# Patient Record
Sex: Female | Born: 1960 | Race: White | Hispanic: No | Marital: Married | Smoking: Former smoker
Health system: Southern US, Community
[De-identification: ages and names within clinical notes are randomized; demographics above are authoritative.]

## PROBLEM LIST (undated history)

## (undated) DIAGNOSIS — F329 Major depressive disorder, single episode, unspecified: Secondary | ICD-10-CM

## (undated) DIAGNOSIS — F32A Depression, unspecified: Secondary | ICD-10-CM

## (undated) DIAGNOSIS — Z5189 Encounter for other specified aftercare: Secondary | ICD-10-CM

## (undated) DIAGNOSIS — H269 Unspecified cataract: Secondary | ICD-10-CM

## (undated) DIAGNOSIS — F419 Anxiety disorder, unspecified: Secondary | ICD-10-CM

## (undated) DIAGNOSIS — G8929 Other chronic pain: Secondary | ICD-10-CM

## (undated) DIAGNOSIS — Z87442 Personal history of urinary calculi: Secondary | ICD-10-CM

## (undated) DIAGNOSIS — M549 Dorsalgia, unspecified: Secondary | ICD-10-CM

## (undated) DIAGNOSIS — C801 Malignant (primary) neoplasm, unspecified: Secondary | ICD-10-CM

## (undated) DIAGNOSIS — M1990 Unspecified osteoarthritis, unspecified site: Secondary | ICD-10-CM

## (undated) DIAGNOSIS — E079 Disorder of thyroid, unspecified: Secondary | ICD-10-CM

## (undated) DIAGNOSIS — R011 Cardiac murmur, unspecified: Secondary | ICD-10-CM

## (undated) HISTORY — DX: Unspecified cataract: H26.9

## (undated) HISTORY — DX: Major depressive disorder, single episode, unspecified: F32.9

## (undated) HISTORY — DX: Malignant (primary) neoplasm, unspecified: C80.1

## (undated) HISTORY — DX: Cardiac murmur, unspecified: R01.1

## (undated) HISTORY — DX: Anxiety disorder, unspecified: F41.9

## (undated) HISTORY — DX: Depression, unspecified: F32.A

## (undated) HISTORY — DX: Other chronic pain: G89.29

## (undated) HISTORY — DX: Disorder of thyroid, unspecified: E07.9

## (undated) HISTORY — DX: Dorsalgia, unspecified: M54.9

## (undated) HISTORY — PX: WRIST FRACTURE SURGERY: SHX121

## (undated) HISTORY — DX: Encounter for other specified aftercare: Z51.89

## (undated) HISTORY — DX: Unspecified osteoarthritis, unspecified site: M19.90

---

## 1985-12-15 HISTORY — PX: LUMBAR LAMINECTOMY: SHX95

## 1986-12-15 HISTORY — PX: OTHER SURGICAL HISTORY: SHX169

## 1986-12-15 HISTORY — PX: LUMBAR LAMINECTOMY: SHX95

## 1998-12-15 HISTORY — PX: OTHER SURGICAL HISTORY: SHX169

## 1999-04-22 ENCOUNTER — Other Ambulatory Visit: Admission: RE | Admit: 1999-04-22 | Discharge: 1999-04-22 | Payer: Self-pay | Admitting: Obstetrics and Gynecology

## 2000-06-05 ENCOUNTER — Other Ambulatory Visit: Admission: RE | Admit: 2000-06-05 | Discharge: 2000-06-05 | Payer: Self-pay | Admitting: Obstetrics and Gynecology

## 2001-06-07 ENCOUNTER — Other Ambulatory Visit: Admission: RE | Admit: 2001-06-07 | Discharge: 2001-06-07 | Payer: Self-pay | Admitting: Obstetrics and Gynecology

## 2001-06-10 ENCOUNTER — Encounter: Admission: RE | Admit: 2001-06-10 | Discharge: 2001-08-14 | Payer: Self-pay | Admitting: Anesthesiology

## 2003-05-17 ENCOUNTER — Encounter: Admission: RE | Admit: 2003-05-17 | Discharge: 2003-05-17 | Payer: Self-pay | Admitting: Obstetrics and Gynecology

## 2003-05-17 ENCOUNTER — Encounter: Payer: Self-pay | Admitting: Obstetrics and Gynecology

## 2004-06-07 ENCOUNTER — Encounter: Admission: RE | Admit: 2004-06-07 | Discharge: 2004-06-07 | Payer: Self-pay | Admitting: Obstetrics and Gynecology

## 2004-06-11 ENCOUNTER — Encounter (INDEPENDENT_AMBULATORY_CARE_PROVIDER_SITE_OTHER): Payer: Self-pay | Admitting: *Deleted

## 2004-06-11 ENCOUNTER — Encounter: Admission: RE | Admit: 2004-06-11 | Discharge: 2004-06-11 | Payer: Self-pay | Admitting: Obstetrics and Gynecology

## 2004-12-23 ENCOUNTER — Encounter: Admission: RE | Admit: 2004-12-23 | Discharge: 2004-12-23 | Payer: Self-pay | Admitting: Obstetrics and Gynecology

## 2005-07-03 ENCOUNTER — Ambulatory Visit (HOSPITAL_COMMUNITY): Admission: RE | Admit: 2005-07-03 | Discharge: 2005-07-03 | Payer: Self-pay | Admitting: Anesthesiology

## 2005-08-28 ENCOUNTER — Encounter: Admission: RE | Admit: 2005-08-28 | Discharge: 2005-08-28 | Payer: Self-pay | Admitting: Obstetrics and Gynecology

## 2007-07-08 ENCOUNTER — Ambulatory Visit: Payer: Self-pay | Admitting: Gastroenterology

## 2007-08-04 ENCOUNTER — Encounter: Payer: Self-pay | Admitting: Gastroenterology

## 2007-08-04 ENCOUNTER — Ambulatory Visit: Payer: Self-pay | Admitting: Gastroenterology

## 2007-08-04 DIAGNOSIS — K2970 Gastritis, unspecified, without bleeding: Secondary | ICD-10-CM | POA: Insufficient documentation

## 2007-08-04 DIAGNOSIS — K2990 Gastroduodenitis, unspecified, without bleeding: Secondary | ICD-10-CM

## 2007-09-27 ENCOUNTER — Ambulatory Visit: Payer: Self-pay | Admitting: Gastroenterology

## 2008-01-06 ENCOUNTER — Encounter: Admission: RE | Admit: 2008-01-06 | Discharge: 2008-01-06 | Payer: Self-pay | Admitting: Obstetrics and Gynecology

## 2008-02-24 DIAGNOSIS — K648 Other hemorrhoids: Secondary | ICD-10-CM | POA: Insufficient documentation

## 2008-02-24 DIAGNOSIS — Z8719 Personal history of other diseases of the digestive system: Secondary | ICD-10-CM

## 2008-02-24 DIAGNOSIS — K219 Gastro-esophageal reflux disease without esophagitis: Secondary | ICD-10-CM

## 2008-02-24 DIAGNOSIS — K5909 Other constipation: Secondary | ICD-10-CM

## 2008-02-24 DIAGNOSIS — N200 Calculus of kidney: Secondary | ICD-10-CM | POA: Insufficient documentation

## 2008-02-24 DIAGNOSIS — F329 Major depressive disorder, single episode, unspecified: Secondary | ICD-10-CM

## 2008-04-13 ENCOUNTER — Encounter: Payer: Self-pay | Admitting: Gastroenterology

## 2008-10-05 ENCOUNTER — Encounter: Payer: Self-pay | Admitting: Gastroenterology

## 2009-10-01 ENCOUNTER — Encounter: Admission: RE | Admit: 2009-10-01 | Discharge: 2009-10-01 | Payer: Self-pay | Admitting: Anesthesiology

## 2011-04-29 NOTE — Assessment & Plan Note (Signed)
[facility]                         GASTROENTEROLOGY OFFICE NOTE   Renee, Allen                         MRN:          [id number]  DATE:09/27/2007                            DOB:          02-Mar-1961    [name] returns for followup of GERD. Her reflux symptoms are under  excellent control on Pantoprazole 40 mg b.i.d. and standard anti-reflux  measures. She has had no further episodes of hematochezia and notes no  constipation. She has no gastrointestinal complaints today.   CURRENT MEDICATIONS:  Listed on the chart, updated and reviewed.   MEDICATION ALLERGIES:  None known.   PHYSICAL EXAMINATION:  Weight 135 pounds, blood pressure 112/72, pulse  80 and regular. She is not re-examined.   ASSESSMENT/PLAN:  1. Gastroesophageal reflux disease. Maintain standard anti-reflux      measures with 4 inch bed blocks. Continue pantoprazole 40 mg p.o.      b.i.d. for the next 2-3 months and then she may decrease to daily      therapy. If this is not adequate, she will resume b.i.d. treatment.      Return office visit one year.  2. Constipation. Maintain a longterm high fiber diet and adequate      fluid intake. Her constipation has resolved.     Venita Lick. Russella Dar, MD, [facility]  Electronically Signed    MTS/MedQ  DD: 09/27/2007  DT: 09/27/2007  Job #: [id number]   cc:   Nadine Counts

## 2011-04-29 NOTE — Assessment & Plan Note (Signed)
[name]                         GASTROENTEROLOGY OFFICE NOTE   [name]                         MRN:          [id number]  DATE:07/08/2007                            DOB:          January 24, 1961    REASON FOR CONSULTATION:  GERD and rectal bleeding.   HISTORY OF PRESENT ILLNESS:  This is a 50 year old white female who has  had problems with frequent reflux for about the past 5-7 years.  She has  tried pantoprazole, Nexium, Prilosec and Zantac.  She has had only fair  relief with the regular use of pantoprazole but she feels this is better  than the other medications she has tried.  She has had episodes of  nausea and vomiting, and she notes worsening problems with epigastric  pain and reflux symptoms when she is off acid suppressants.  She has had  chronic constipation that has not changed.  She notes a 10 pounds weight  loss.  She has a history of intermittent small volume hematochezia over  the past 2 years but about 3 weeks ago she had a moderate amount of  bright red blood per rectum associated with a bowel movement.  She  relates prior diagnosis of internal hemorrhoids.  She notes no change in  stool caliber, melena or hematemesis.  No family history of colon  cancer, colon polyps or inflammatory bowel disease.   PAST MEDICAL HISTORY:  1. GERD.  2. Post ablative hypothyroidism.  3. Depression.  4. Kidney stones.  5. Status post lumbar fusion x2.  6. Status post laminectomy x2.   CURRENT MEDICATIONS:  Listed on the chart.   MEDICATION ALLERGIES:  NONE KNOWN.   SOCIAL HISTORY:  Per the handwritten form.   REVIEW OF SYSTEMS:  Per the handwritten form.   PHYSICAL EXAMINATION:  Well-developed, well-nourished, no acute  distress.  Height 5 feet 6 inches, weight 137 pounds, blood pressure is  114/80, pulse 80 and regular.  HEENT:  Anicteric sclerae, oropharynx clear.  CHEST:  Clear to auscultation bilaterally.  CARDIAC:  Regular  rate and rhythm without murmurs appreciated.  ABDOMEN:  Soft, nontender, nondistended, normoactive bowel sounds; no  palpable organomegaly, masses or hernias.  RECTAL:  Examination deferred to time of colonoscopy.  EXTREMITIES:  Without clubbing, cyanosis or edema.  NEUROLOGIC:  Alert and oriented x3.  Grossly nonfocal.   ASSESSMENT AND PLAN:  1. Chronic gastroesophageal reflux disease with only fair control of      symptoms.  Rule out erosive esophagitis and Barrett's esophagus.      She is to maintain all standard anti-reflux measures and begin the      use of 4 inch bed blocks.  Continue pantoprazole 40 mg p.o. daily.      She may need to increase to b.i.d. therapy after the EGD.  Risks,      benefits and alternatives to upper endoscopy with possible biopsy      discussed with the patient and she consents to proceed.  This will      be scheduled electively.  2. Worsening hematochezia in the setting of chronic  constipation.      Rule out colorectal neoplasms, hemorrhoids and other disorders.      Risks, benefits and alternatives to colonoscopy with possible      biopsy, possible polypectomy and possible destruction of internal      hemorrhoids discussed with the patient and she consents to proceed.      This will be scheduled electively.  She will begin a stool softener      on a daily basis and increase her fiber and fluid intake.  She may      use milk of magnesia and MiraLax on an as needed basis.     [name]. Renee Dar, MD, [facility]  Electronically Signed    MTS/MedQ  DD: 07/13/2007  DT: 07/14/2007  Job #: [id number]   cc:   [name]

## 2011-05-02 NOTE — H&P (Signed)
[facility]  Patient:    [name], [name]                         MRN: [id number] Adm. Date:  [id number] Attending:  Thyra Breed CC:         Dr. Imagene Sheller, M.D., Ph.D.  Michell Heinrich, M.D.   History and Physical  NEW PATIENT EVALUATION  DATE OF BIRTH:  [birth year]  HISTORY OF PRESENT ILLNESS:  [name] is a very pleasant, 50 year old who is sent to [place] by Dr. Polly Cobia for evaluation and management of her back pain.  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  She was operated on by Dr. Marinell Blight in August of 1987 and did great.  She felt so good that she over exerted herself and had a recurrent disc herniation at L4/L5, and in January had a repeat surgery, but did not have as good an outcome.  In the postoperative period she apparently sneezed and reinjured her back.  It was not until June 1988 that she underwent a fusion by Dr. Nicholes Rough and Harl Bowie.  She did well following this up until 2000.  In 2000, she and her husband bought a new home and they were landscaping and she heard a pop in her back.  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  She did not followup with him, but returned to to her primary care physician, Dr. Harrison Mons, who sent her to Dr. Meryl Dare at [facility] who evaluated her. After his evaluations he had Dr. Dorita Fray evaluate her and together they did laminotomies from L2 to L5 as well as diskectomy at L3/L4 and fusion. Postoperative she stated she did very well, and she is very glad that she had the operation, but she was left with some pain.  She was placed on OxyContin in January 2002 by Dr. Rise Mu at 20 mg 2 x q.d. and had  gone up to 40 mg 2 x q.d. with oxycodone 5 mg, up to 6 tablets a day for control of her discomfort.  She presents today for evaluation and maintenance of her opiates.  She states with the OxyContin she is able to work and carry on a productive life.  She has a 90-year-old child at home.  She does feel a considerable amount of pressure and guilt from the family over the fact that she takes OxyContin, but states that if she does not take it she cannot work.  She has been tried on adjuvants in the form of Neurontin and Flexeril, but they make her too sleepy to take.  She takes Vioxx which is helpful.  She has been on Prozac for 12 years.  At present, she feels pretty good.  She notes that her pain is increased with sitting and she does intermittently numbness and tingling over the lateral thighs with prolonged sitting.  She denied any bowel or bladder incontinence. She notes that when she can leans forward for prolonged periods of time she develops shakiness overall.  CURRENT MEDICATIONS: 1. OxyContin 40 mg b.i.d. 2. Oxycodone 5 mg 2 q.3-4h.; she takes up to 6 tablets q.d. 3. Flexeril 10 mg p.r.n. 4. Vioxx  25 mg q.d. 5. Birth control pills. 6. Prozac 20 mg q.d.  ALLERGIES:  No known drug allergies.  FAMILY HISTORY:  Positive for thyroid disease and prostate cancer.  ACTIVE MEDICAL PROBLEMS:  History of Graves disease and osteoarthritis with history of cellulitis for which she was recently hospitalized by Dr. Harrison Mons.  PAST SURGICAL HISTORY:  Significant for the four back surgeries, otherwise no other surgeries.  SOCIAL HISTORY:  The patient is a nonsmoker and nondrinker.  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  REVIEW OF SYSTEMS:  GENERAL:  Significant for long history of night sweats. HEAD:  Negative.  EYES:  Negative.  NOSE/MOUTH/THROAT/EARS:  Negative. PULMONARY:  Negative.  CARDIOVASCULAR:  Significant for murmur for which she sees Dr. Harrison Mons.  GI:  Significant for  belching when she takes the OxyContin, but minimal constipation.  GU:  History of renal calculi.  MUSCULOSKELETAL: See HPI.  NEUROLOGIC:  See HPI.  No history of seizure or stroke.  CUTANEOUS: History of cellulitis recently.  ENDOCRINE:  History of Graves disease treated by Dr. Criss Alvine, no diabetes.  HEMATOLOGIC:  Negative.  PSYCHIATRIC:  Positive for depression for which she is on Prozac.  ALLERGY/IMMUNOLOGIC:  Negative.  PHYSICAL EXAMINATION:  VITAL SIGNS:  Blood pressure 132/69, heart rate 72, respiratory rate 16, and O2 saturation 99%.  Temperature 99.5.  Pain level 7/10.  GENERAL:  This is a pleasant female in no acute distress.  HEAD:  Normocephalic, atraumatic.  EYES:  Extraocular movements intact with conjunctivae and sclerae clear.  NOSE:  Patent nares.  OROPHARYNX:  Free of lesions.  NECK:  Supple without lymphadenopathy.  She does have a palpable thyroid. Carotids were 2+ and symmetric without bruits.  LUNGS:  Clear.  HEART:  Regular rate and rhythm with a grade 2/6 systolic murmur along left sternal border.  BREASTS/ABDOMINAL/PELVIC/RECTAL:  Exams were not performed.  BACK:  Exam revealed well-healed surgical scar with gibbus-type change at the top of her fusion.  She has intact forward flexion and fairly intact extension.  Straight leg raise signs were negative.  EXTREMITIES:  Radial pulses and dorsalis pedis pulses were 2+ and symmetric. She had no peripheral edema.  SKIN:  Significant for some reactive changes where she has had her TENS unit placed.  NEUROLOGIC:  The patient was oriented to person, place, time, and reason for visit.  Cranial nerves II-XII were grossly intact.  Deep tendon reflexes were symmetric in the upper extremities and lower extremities with downgoing toes.  Motor was 5/5 with symmetric bulk and tone.  Coordination was grossly intact. Sensory was intact to light touch and vibratory sense.  IMPRESSION: 1. 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 2. History of Graves disease per Dr. Harrison Mons. 3. Recent cellulitis per Dr. Harrison Mons. 4. Depression currently being treated by Dr. Harrison Mons.  DISPOSITION: 1. I feel the patient does need to continue on opiates on a time contingent    basis and advocated that we go up to 40 mg 1 p.o. q.8h. of the OxyContin,    #90, so that she does not have to take so much of the oxycodone for rescue    pain.  She is taking at least 30 mg q.d. 2. Oxycodone 5 mg 1-2 p.o. q.4-6h. p.r.n., #100 with no refill. 3. I recommended she consider seeing our psychologist for pain coping skills.    She is doing very well, but  I feel like she is under a considerable amount    of stress trying to balance work and a family with her pain.  She will    entertain this. 4. Followup with me in four weeks to reassess where we are at with her on the    increased dose of OxyContin. 5.  We will go ahead and have her sign a controlled substance contract. DD:  06/11/01 TD:  06/11/01 Job: [id number]

## 2011-05-02 NOTE — H&P (Signed)
[facility]  Patient:    [name], [name]                         MRN: [id number] Adm. Date:  [id number] Attending:  Thyra Breed CC:         Nadine Counts, M.D.  Owens Shark, M.D.  Dorita Fray, M.D.   History and Physical  FOLLOW-UP EVALUATION:  [name] comes in for follow-up evaluation of her chronic low back pain on the basis of failed back surgery syndrome. Since her last evaluation, the patient has noted overall stabilization with regard to the pain with increasing the OxyContin to 40 mg three times a day and using the blue stuff. She also continues on her Vioxx, Flexeril, Prozac, birth control pills, and Synthroid. She is down to requiring about three to four tablets of OxyIR per day.  She is scheduled to see Dr. Jerrye Beavers next week.  She notes that bedrest helps but bending or sitting for long periods increase her back discomfort.  PHYSICAL EXAMINATION:  VITAL SIGNS:  Blood pressure is 128/62, heart rate is 76, respiratory rate is 12, O2 saturation is 100%, pain level is 3/10.  NEUROLOGICAL:  She exhibits an intact gait with negative straight leg raise signs today. Her deep tendon reflexes were symmetric.  IMPRESSION: 1. 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 2. Other medical problems per primary care physician.  DISPOSITION: 1. Continue OxyContin 40 mg one p.o. q.8h., #90 with no refill. 2. Oxycodone 5 mg one p.o. q.6h. p.r.n. breakthrough pain, #100. 3. She is to see Dr. Lodema Hong next week for pain coping skills and hopefully    some biofeedback training in addition. 4. Follow up with me in four to eight weeks. DD:  07/09/01 TD:  07/10/01 Job: [id number]

## 2011-05-19 ENCOUNTER — Ambulatory Visit
Admission: RE | Admit: 2011-05-19 | Discharge: 2011-05-19 | Disposition: A | Discharge status: Home / Self Care | Payer: BC Managed Care – PPO | Source: Ambulatory Visit | Attending: Anesthesiology | Admitting: Anesthesiology

## 2011-05-19 ENCOUNTER — Other Ambulatory Visit: Payer: Self-pay | Admitting: Anesthesiology

## 2011-05-19 DIAGNOSIS — M549 Dorsalgia, unspecified: Secondary | ICD-10-CM

## 2012-09-13 ENCOUNTER — Other Ambulatory Visit: Payer: Self-pay | Admitting: Nurse Practitioner

## 2012-09-13 DIAGNOSIS — Z1231 Encounter for screening mammogram for malignant neoplasm of breast: Secondary | ICD-10-CM

## 2012-10-07 ENCOUNTER — Ambulatory Visit
Admission: RE | Admit: 2012-10-07 | Discharge: 2012-10-07 | Disposition: A | Discharge status: Home / Self Care | Payer: Medicare Other | Source: Ambulatory Visit | Attending: Nurse Practitioner | Admitting: Nurse Practitioner

## 2012-10-07 DIAGNOSIS — Z1231 Encounter for screening mammogram for malignant neoplasm of breast: Secondary | ICD-10-CM

## 2013-09-26 ENCOUNTER — Other Ambulatory Visit: Payer: Self-pay

## 2013-09-26 DIAGNOSIS — Z1231 Encounter for screening mammogram for malignant neoplasm of breast: Secondary | ICD-10-CM

## 2013-10-19 ENCOUNTER — Ambulatory Visit
Admission: RE | Admit: 2013-10-19 | Discharge: 2013-10-19 | Disposition: A | Discharge status: Home / Self Care | Payer: Medicare Other | Source: Ambulatory Visit

## 2013-10-19 DIAGNOSIS — Z1231 Encounter for screening mammogram for malignant neoplasm of breast: Secondary | ICD-10-CM

## 2014-12-15 DIAGNOSIS — C801 Malignant (primary) neoplasm, unspecified: Secondary | ICD-10-CM

## 2014-12-15 HISTORY — DX: Malignant (primary) neoplasm, unspecified: C80.1

## 2015-06-25 ENCOUNTER — Telehealth: Payer: Self-pay | Admitting: Gastroenterology

## 2015-06-25 NOTE — Telephone Encounter (Signed)
Left a message for patient to let her know that she is not returning my call. I have not called her originally but if she has questions she can call me back.

## 2015-06-26 IMAGING — MG MM DIGITAL SCREENING BILAT W/ CAD
9 of 13 series · 9 of 29 positions shown · non-contrast
Comparison: Previous exam(s).

CLINICAL DATA: Screening.

EXAM:
DIGITAL SCREENING BILATERAL MAMMOGRAM WITH CAD
DIGITAL BREAST TOMOSYNTHESIS
Digital breast tomosynthesis images are acquired in two projections.
These images are reviewed in combination with the digital mammogram,
confirming the findings below.

[R MLO (1 of 2)]
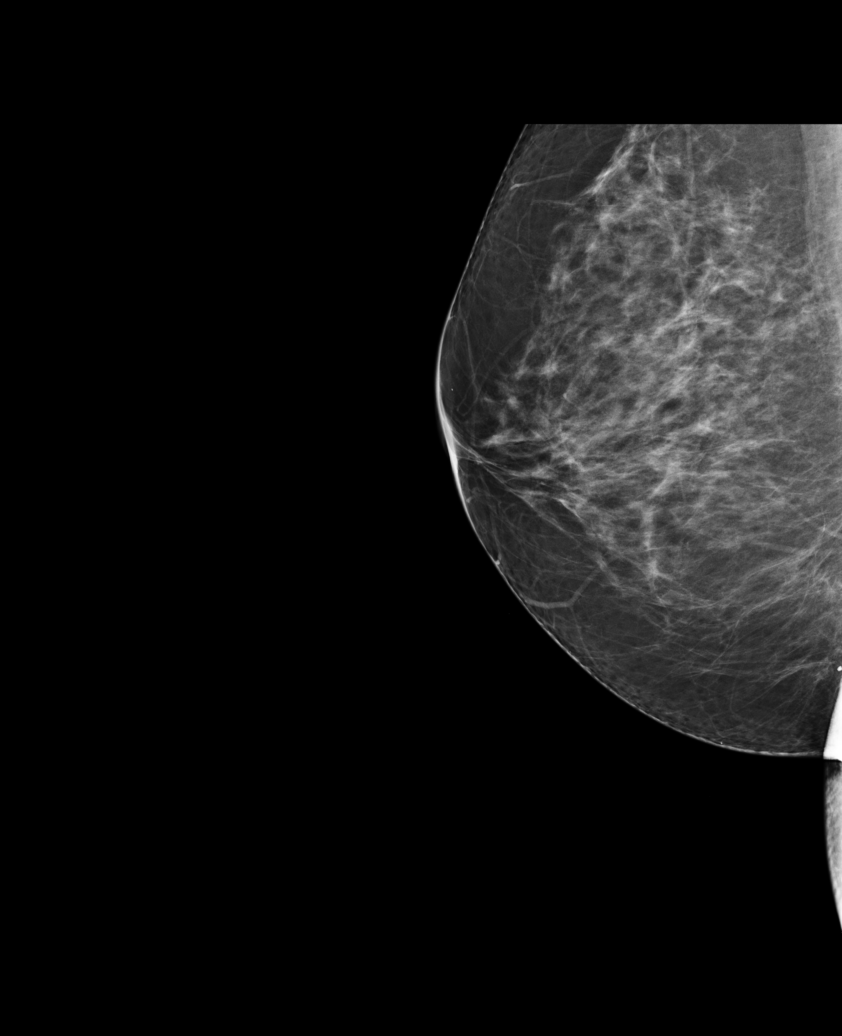

[R CC]
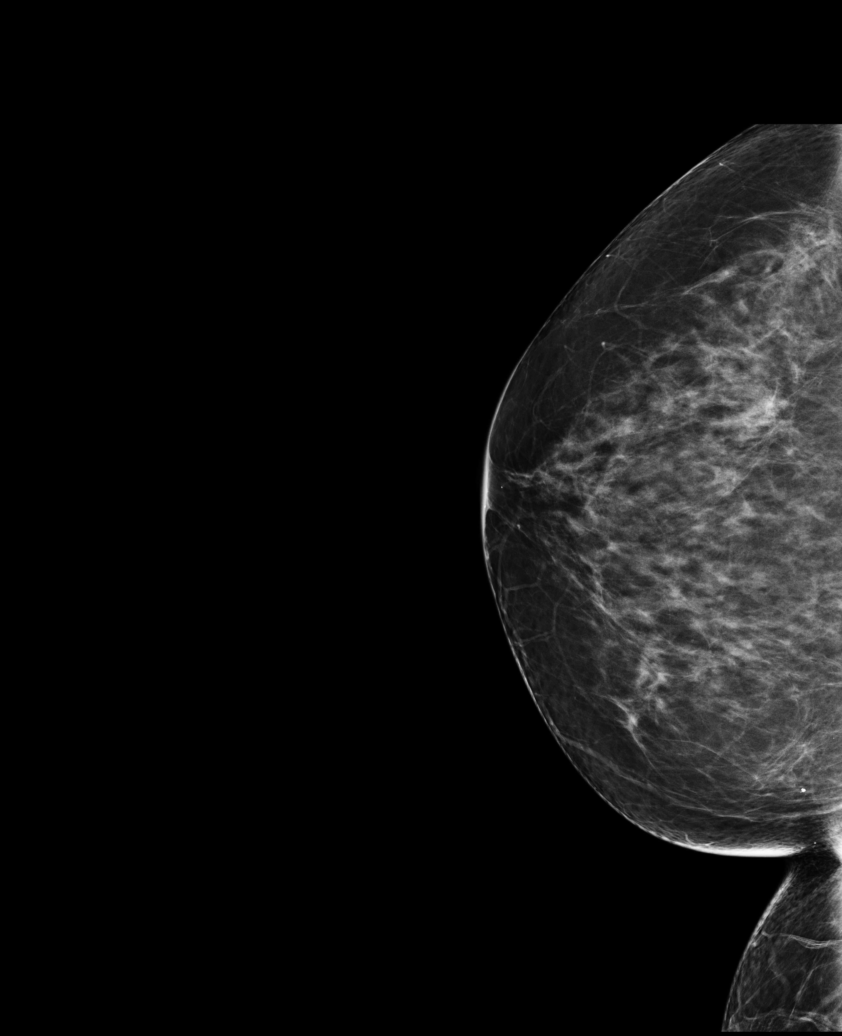

[L CC]
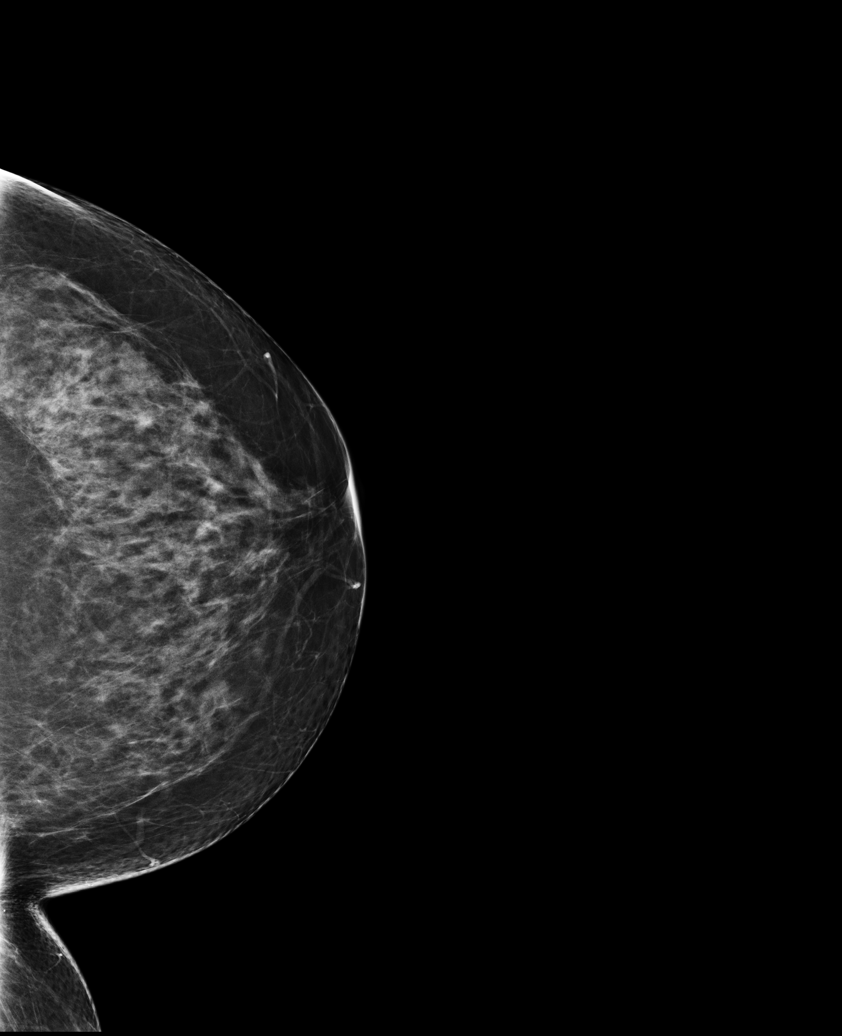

[L MLO]
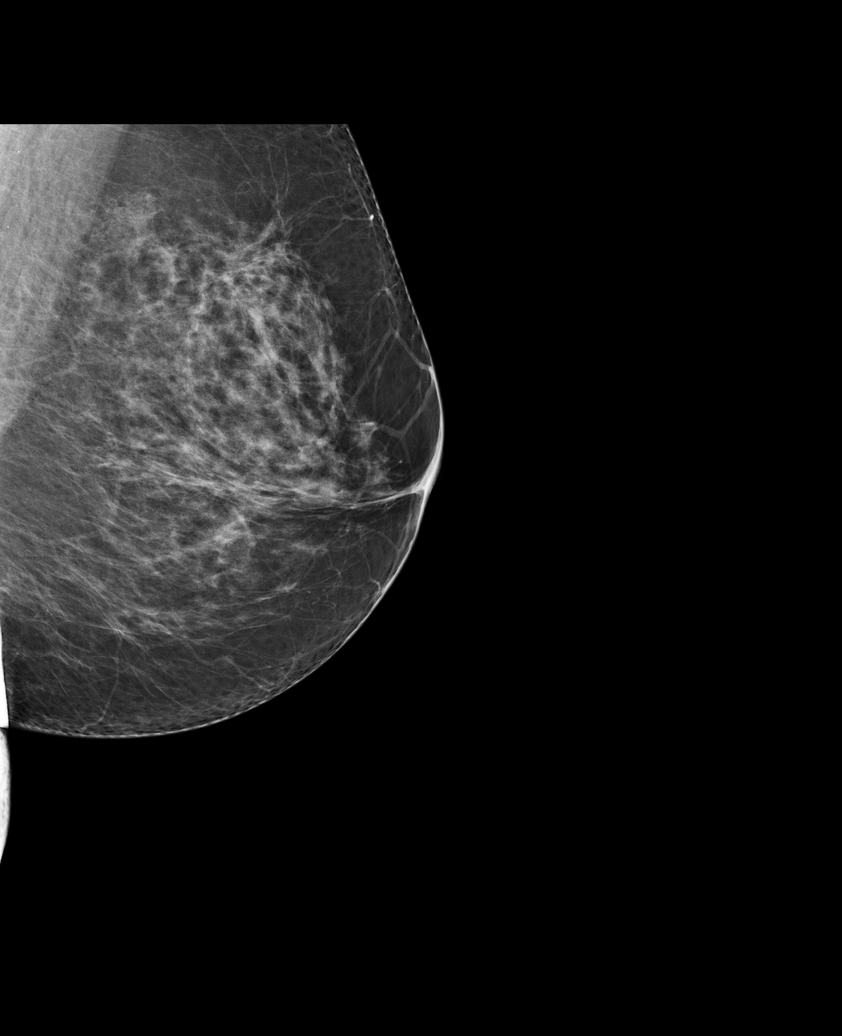

[R MLO (2 of 2)]
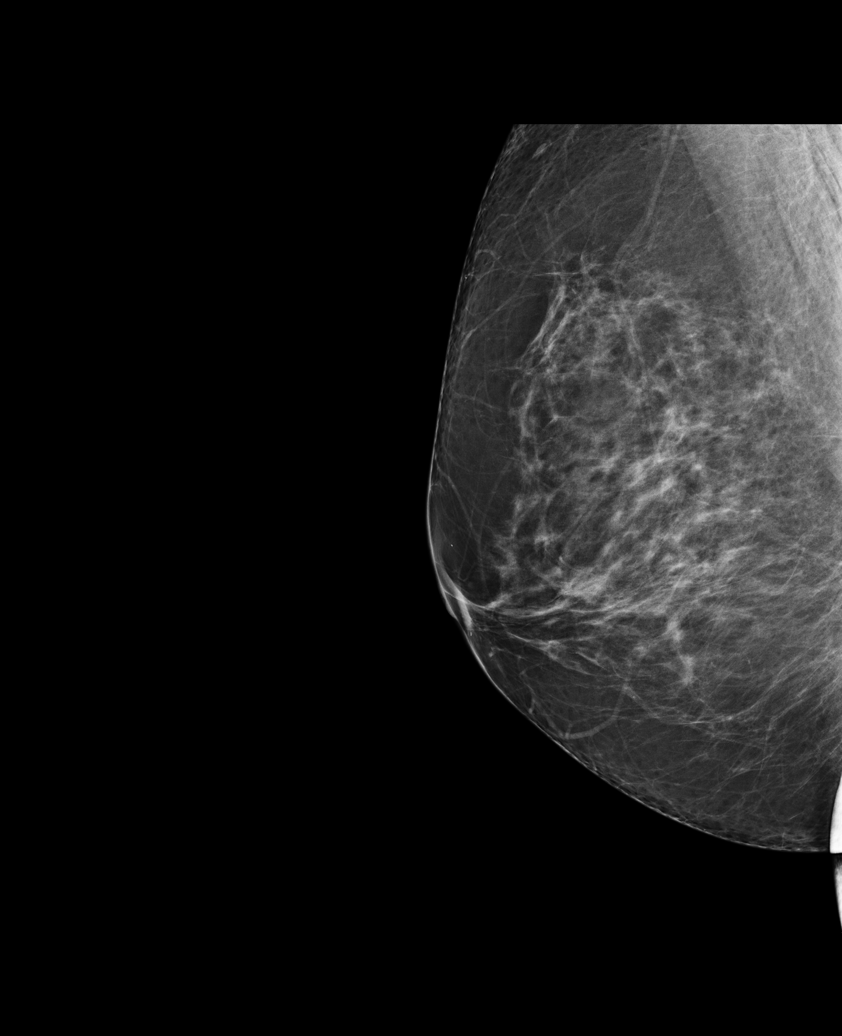

[L CC tomo]
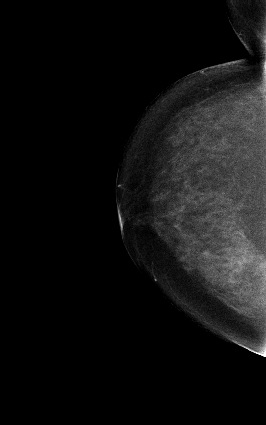

[R CC tomo]
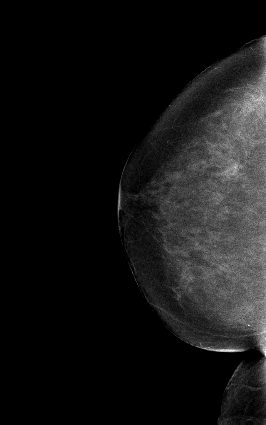

[L MLO tomo]
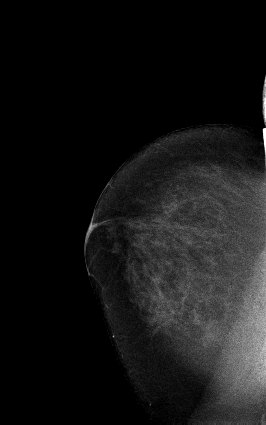

[R MLO tomo]
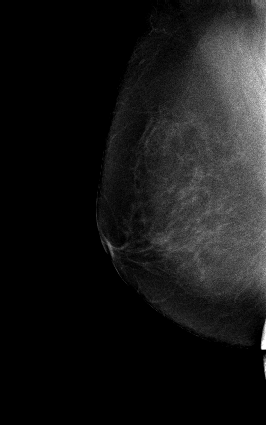

[9 of 29 positions shown; findings below may reference images not displayed]

ACR Breast Density Category b: There are scattered areas of
fibroglandular density.
FINDINGS: There are no findings suspicious for malignancy. Images were
processed with CAD.
IMPRESSION: No mammographic evidence of malignancy. A result letter of this
screening mammogram will be mailed directly to the patient.

RECOMMENDATION:
Screening mammogram in one year. (Code:Y6-I-0VQ)

BI-RADS CATEGORY  1: Negative

## 2016-12-30 DIAGNOSIS — E039 Hypothyroidism, unspecified: Secondary | ICD-10-CM | POA: Diagnosis not present

## 2016-12-30 DIAGNOSIS — F341 Dysthymic disorder: Secondary | ICD-10-CM | POA: Diagnosis not present

## 2016-12-30 DIAGNOSIS — E785 Hyperlipidemia, unspecified: Secondary | ICD-10-CM | POA: Diagnosis not present

## 2016-12-30 DIAGNOSIS — M549 Dorsalgia, unspecified: Secondary | ICD-10-CM | POA: Diagnosis not present

## 2016-12-30 DIAGNOSIS — Z23 Encounter for immunization: Secondary | ICD-10-CM | POA: Diagnosis not present

## 2016-12-30 DIAGNOSIS — Z6824 Body mass index (BMI) 24.0-24.9, adult: Secondary | ICD-10-CM | POA: Diagnosis not present

## 2016-12-30 DIAGNOSIS — Z79899 Other long term (current) drug therapy: Secondary | ICD-10-CM | POA: Diagnosis not present

## 2017-01-06 DIAGNOSIS — G894 Chronic pain syndrome: Secondary | ICD-10-CM | POA: Diagnosis not present

## 2017-01-06 DIAGNOSIS — M961 Postlaminectomy syndrome, not elsewhere classified: Secondary | ICD-10-CM | POA: Diagnosis not present

## 2017-01-06 DIAGNOSIS — M47816 Spondylosis without myelopathy or radiculopathy, lumbar region: Secondary | ICD-10-CM | POA: Diagnosis not present

## 2017-01-06 DIAGNOSIS — Z79891 Long term (current) use of opiate analgesic: Secondary | ICD-10-CM | POA: Diagnosis not present

## 2017-02-24 DIAGNOSIS — G894 Chronic pain syndrome: Secondary | ICD-10-CM | POA: Diagnosis not present

## 2017-02-24 DIAGNOSIS — M961 Postlaminectomy syndrome, not elsewhere classified: Secondary | ICD-10-CM | POA: Diagnosis not present

## 2017-02-24 DIAGNOSIS — Z79891 Long term (current) use of opiate analgesic: Secondary | ICD-10-CM | POA: Diagnosis not present

## 2017-02-24 DIAGNOSIS — M47816 Spondylosis without myelopathy or radiculopathy, lumbar region: Secondary | ICD-10-CM | POA: Diagnosis not present

## 2017-04-06 DIAGNOSIS — H04123 Dry eye syndrome of bilateral lacrimal glands: Secondary | ICD-10-CM | POA: Diagnosis not present

## 2017-04-21 DIAGNOSIS — G894 Chronic pain syndrome: Secondary | ICD-10-CM | POA: Diagnosis not present

## 2017-04-21 DIAGNOSIS — M961 Postlaminectomy syndrome, not elsewhere classified: Secondary | ICD-10-CM | POA: Diagnosis not present

## 2017-04-21 DIAGNOSIS — M47816 Spondylosis without myelopathy or radiculopathy, lumbar region: Secondary | ICD-10-CM | POA: Diagnosis not present

## 2017-04-21 DIAGNOSIS — Z79891 Long term (current) use of opiate analgesic: Secondary | ICD-10-CM | POA: Diagnosis not present

## 2017-06-02 ENCOUNTER — Encounter: Payer: Self-pay | Admitting: Gastroenterology

## 2017-06-16 DIAGNOSIS — Z79891 Long term (current) use of opiate analgesic: Secondary | ICD-10-CM | POA: Diagnosis not present

## 2017-06-16 DIAGNOSIS — M961 Postlaminectomy syndrome, not elsewhere classified: Secondary | ICD-10-CM | POA: Diagnosis not present

## 2017-06-16 DIAGNOSIS — G894 Chronic pain syndrome: Secondary | ICD-10-CM | POA: Diagnosis not present

## 2017-06-16 DIAGNOSIS — M62838 Other muscle spasm: Secondary | ICD-10-CM | POA: Diagnosis not present

## 2017-06-16 DIAGNOSIS — M47816 Spondylosis without myelopathy or radiculopathy, lumbar region: Secondary | ICD-10-CM | POA: Diagnosis not present

## 2017-06-16 DIAGNOSIS — M150 Primary generalized (osteo)arthritis: Secondary | ICD-10-CM | POA: Diagnosis not present

## 2017-06-29 ENCOUNTER — Encounter: Payer: Self-pay | Admitting: Gastroenterology

## 2017-07-01 DIAGNOSIS — Z01419 Encounter for gynecological examination (general) (routine) without abnormal findings: Secondary | ICD-10-CM | POA: Diagnosis not present

## 2017-07-01 DIAGNOSIS — Z0000 Encounter for general adult medical examination without abnormal findings: Secondary | ICD-10-CM | POA: Diagnosis not present

## 2017-08-19 ENCOUNTER — Ambulatory Visit (AMBULATORY_SURGERY_CENTER): Payer: Self-pay | Admitting: *Deleted

## 2017-08-19 VITALS — Ht 66.0 in | Wt 151.6 lb

## 2017-08-19 DIAGNOSIS — Z1211 Encounter for screening for malignant neoplasm of colon: Secondary | ICD-10-CM

## 2017-08-19 MED ORDER — NA SULFATE-K SULFATE-MG SULF 17.5-3.13-1.6 GM/177ML PO SOLN: ORAL | 0 refills | Status: DC

## 2017-08-19 NOTE — Progress Notes (Signed)
No allergies to eggs or soy. No problems with anesthesia.  Pt given Emmi instructions for colonoscopy  No oxygen use  No diet drug use  

## 2017-08-25 DIAGNOSIS — G894 Chronic pain syndrome: Secondary | ICD-10-CM | POA: Diagnosis not present

## 2017-08-25 DIAGNOSIS — Z79891 Long term (current) use of opiate analgesic: Secondary | ICD-10-CM | POA: Diagnosis not present

## 2017-08-25 DIAGNOSIS — M961 Postlaminectomy syndrome, not elsewhere classified: Secondary | ICD-10-CM | POA: Diagnosis not present

## 2017-08-25 DIAGNOSIS — M47816 Spondylosis without myelopathy or radiculopathy, lumbar region: Secondary | ICD-10-CM | POA: Diagnosis not present

## 2017-08-27 ENCOUNTER — Encounter: Payer: Self-pay | Admitting: Gastroenterology

## 2017-08-27 ENCOUNTER — Telehealth: Payer: Self-pay | Admitting: Gastroenterology

## 2017-08-27 NOTE — Telephone Encounter (Signed)
Left message for patient to call back  

## 2017-08-28 NOTE — Telephone Encounter (Signed)
Patient advised that she will need an OV for eval to consider EGD.  She wishes to proceed with colonoscopy as planned and will think about setting up appt for EGD.  She states that her symptoms do not seem as bad today.

## 2017-09-02 ENCOUNTER — Telehealth: Payer: Self-pay | Admitting: Gastroenterology

## 2017-09-02 ENCOUNTER — Ambulatory Visit (AMBULATORY_SURGERY_CENTER): Payer: PPO | Admitting: Gastroenterology

## 2017-09-02 ENCOUNTER — Encounter: Payer: Self-pay | Admitting: Gastroenterology

## 2017-09-02 VITALS — BP 119/75 | HR 71 | Temp 97.8°F | Resp 13 | Ht 66.0 in | Wt 151.0 lb

## 2017-09-02 DIAGNOSIS — K2970 Gastritis, unspecified, without bleeding: Secondary | ICD-10-CM | POA: Diagnosis not present

## 2017-09-02 DIAGNOSIS — Z1212 Encounter for screening for malignant neoplasm of rectum: Secondary | ICD-10-CM

## 2017-09-02 DIAGNOSIS — Z1211 Encounter for screening for malignant neoplasm of colon: Secondary | ICD-10-CM

## 2017-09-02 DIAGNOSIS — K219 Gastro-esophageal reflux disease without esophagitis: Secondary | ICD-10-CM | POA: Diagnosis not present

## 2017-09-02 MED ORDER — SODIUM CHLORIDE 0.9 % IV SOLN: 500.0000 mL | INTRAVENOUS | Status: DC

## 2017-09-02 NOTE — Op Note (Signed)
[place] Patient Name: [name] Procedure Date: 09/02/2017 11:52 AM MRN: [id number] Endoscopist: Ladene Artist , MD Age: 56 Referring MD:  Date of Birth: 1961-09-30 Gender: Female Account #: [id number] Procedure:                Colonoscopy Indications:              Screening for colorectal malignant neoplasm Medicines:                Monitored Anesthesia Care Procedure:                Pre-Anesthesia Assessment:                           - Prior to the procedure, a History and Physical                            was performed, and patient medications and                            allergies were reviewed. The patient's tolerance of                            previous anesthesia was also reviewed. The risks                            and benefits of the procedure and the sedation                            options and risks were discussed with the patient.                            All questions were answered, and informed consent                            was obtained. Prior Anticoagulants: The patient has                            taken no previous anticoagulant or antiplatelet                            agents. ASA Grade Assessment: II - A patient with                            mild systemic disease. After reviewing the risks                            and benefits, the patient was deemed in                            satisfactory condition to undergo the procedure.                           After obtaining informed consent, the colonoscope  was passed under direct vision. Throughout the                            procedure, the patient's blood pressure, pulse, and                            oxygen saturations were monitored continuously. The                            Model PCF-[id number]) scope was introduced                            through the anus and advanced to the the cecum,                            identified by  appendiceal orifice and ileocecal                            valve. The ileocecal valve, appendiceal orifice,                            and rectum were photographed. The quality of the                            bowel preparation was excellent. The colonoscopy                            was performed without difficulty. The patient                            tolerated the procedure well. Scope In: 12:11:00 PM Scope Out: 12:27:36 PM Scope Withdrawal Time: 0 hours 11 minutes 29 seconds  Total Procedure Duration: 0 hours 16 minutes 36 seconds  Findings:                 The perianal and digital rectal examinations were                            normal.                           The entire examined colon appeared normal on direct                            and retroflexion views. Complications:            No immediate complications. Estimated blood loss:                            None. Estimated Blood Loss:     Estimated blood loss: none. Impression:               - The entire examined colon is normal on direct and                            retroflexion views.                           -  No specimens collected. Recommendation:           - Repeat colonoscopy in 10 years for screening                            purposes.                           - Patient has a contact number available for                            emergencies. The signs and symptoms of potential                            delayed complications were discussed with the                            patient. Return to normal activities tomorrow.                            Written discharge instructions were provided to the                            patient.                           - Resume previous diet.                           - Continue present medications. Ladene Artist, MD 09/02/2017 12:30:17 PM This report has been signed electronically.

## 2017-09-02 NOTE — Patient Instructions (Signed)
Impression/Recommendations:  Normal exam  YOU HAD AN ENDOSCOPIC PROCEDURE TODAY AT [facility]:   Refer to the procedure report that was given to you for any specific questions about what was found during the examination.  If the procedure report does not answer your questions, please call your gastroenterologist to clarify.  If you requested that your care partner not be given the details of your procedure findings, then the procedure report has been included in a sealed envelope for you to review at your convenience later.  YOU SHOULD EXPECT: Some feelings of bloating in the abdomen. Passage of more gas than usual.  Walking can help get rid of the air that was put into your GI tract during the procedure and reduce the bloating. If you had a lower endoscopy (such as a colonoscopy or flexible sigmoidoscopy) you may notice spotting of blood in your stool or on the toilet paper. If you underwent a bowel prep for your procedure, you may not have a normal bowel movement for a few days.  Please Note:  You might notice some irritation and congestion in your nose or some drainage.  This is from the oxygen used during your procedure.  There is no need for concern and it should clear up in a day or so.  SYMPTOMS TO REPORT IMMEDIATELY:   Following lower endoscopy (colonoscopy or flexible sigmoidoscopy):  Excessive amounts of blood in the stool  Significant tenderness or worsening of abdominal pains  Swelling of the abdomen that is new, acute  Fever of 100F or higher   For urgent or emergent issues, a gastroenterologist can be reached at any hour by calling [phone].   DIET:  We do recommend a small meal at first, but then you may proceed to your regular diet.  Drink plenty of fluids but you should avoid alcoholic beverages for 24 hours.  ACTIVITY:  You should plan to take it easy for the rest of today and you should NOT DRIVE or use heavy machinery until tomorrow (because of  the sedation medicines used during the test).    FOLLOW UP: Our staff will call the number listed on your records the next business day following your procedure to check on you and address any questions or concerns that you may have regarding the information given to you following your procedure. If we do not reach you, we will leave a message.  However, if you are feeling well and you are not experiencing any problems, there is no need to return our call.  We will assume that you have returned to your regular daily activities without incident.  If any biopsies were taken you will be contacted by phone or by letter within the next 1-3 weeks.  Please call us at [phone] if you have not heard about the biopsies in 3 weeks.    SIGNATURES/CONFIDENTIALITY: You and/or your care partner have signed paperwork which will be entered into your electronic medical record.  These signatures attest to the fact that that the information above on your After Visit Summary has been reviewed and is understood.  Full responsibility of the confidentiality of this discharge information lies with you and/or your care-partner.

## 2017-09-02 NOTE — Progress Notes (Signed)
Report given to PACU, vss 

## 2017-09-02 NOTE — Telephone Encounter (Signed)
  Oncall Note Patient called complaining that she vomited after drinking first half of prep, she hasn't had any bowel movements. Patient didn't want to drink any further prep overnight or take any antinausea medication that I offered. She wants to drink the second half of prep in the morning and call us back

## 2017-09-02 NOTE — Progress Notes (Signed)
No changes in medical or surgical hx since PV per pt  Pt has several abrasions to right arm noted- states her puppy scratched her.

## 2017-09-03 ENCOUNTER — Telehealth: Payer: Self-pay

## 2017-09-03 NOTE — Telephone Encounter (Signed)
  Follow up Call-  Call back number 09/02/2017  Post procedure Call Back phone  # [phone]  Permission to leave phone message Yes  Some recent data might be hidden     Patient questions:  Do you have a fever, pain , or abdominal swelling? No. Pain Score  0 *  Have you tolerated food without any problems? Yes.    Have you been able to return to your normal activities? Yes.    Do you have any questions about your discharge instructions: Diet   No. Medications  No. Follow up visit  No.  Do you have questions or concerns about your Care? No.  Actions: * If pain score is 4 or above: No action needed, pain <4.

## 2017-10-27 DIAGNOSIS — M47816 Spondylosis without myelopathy or radiculopathy, lumbar region: Secondary | ICD-10-CM | POA: Diagnosis not present

## 2017-10-27 DIAGNOSIS — Z79891 Long term (current) use of opiate analgesic: Secondary | ICD-10-CM | POA: Diagnosis not present

## 2017-10-27 DIAGNOSIS — M961 Postlaminectomy syndrome, not elsewhere classified: Secondary | ICD-10-CM | POA: Diagnosis not present

## 2017-10-27 DIAGNOSIS — G894 Chronic pain syndrome: Secondary | ICD-10-CM | POA: Diagnosis not present

## 2017-10-28 DIAGNOSIS — Z1231 Encounter for screening mammogram for malignant neoplasm of breast: Secondary | ICD-10-CM | POA: Diagnosis not present

## 2017-11-09 DIAGNOSIS — H04123 Dry eye syndrome of bilateral lacrimal glands: Secondary | ICD-10-CM | POA: Diagnosis not present

## 2017-12-24 DIAGNOSIS — M961 Postlaminectomy syndrome, not elsewhere classified: Secondary | ICD-10-CM | POA: Diagnosis not present

## 2017-12-24 DIAGNOSIS — M47816 Spondylosis without myelopathy or radiculopathy, lumbar region: Secondary | ICD-10-CM | POA: Diagnosis not present

## 2017-12-24 DIAGNOSIS — Z79891 Long term (current) use of opiate analgesic: Secondary | ICD-10-CM | POA: Diagnosis not present

## 2017-12-24 DIAGNOSIS — G894 Chronic pain syndrome: Secondary | ICD-10-CM | POA: Diagnosis not present

## 2017-12-31 DIAGNOSIS — R600 Localized edema: Secondary | ICD-10-CM | POA: Diagnosis not present

## 2017-12-31 DIAGNOSIS — Z85820 Personal history of malignant melanoma of skin: Secondary | ICD-10-CM | POA: Diagnosis not present

## 2017-12-31 DIAGNOSIS — I8310 Varicose veins of unspecified lower extremity with inflammation: Secondary | ICD-10-CM | POA: Diagnosis not present

## 2017-12-31 DIAGNOSIS — C44529 Squamous cell carcinoma of skin of other part of trunk: Secondary | ICD-10-CM | POA: Diagnosis not present

## 2018-02-04 DIAGNOSIS — F341 Dysthymic disorder: Secondary | ICD-10-CM | POA: Diagnosis not present

## 2018-02-04 DIAGNOSIS — E785 Hyperlipidemia, unspecified: Secondary | ICD-10-CM | POA: Diagnosis not present

## 2018-02-04 DIAGNOSIS — E039 Hypothyroidism, unspecified: Secondary | ICD-10-CM | POA: Diagnosis not present

## 2018-02-04 DIAGNOSIS — M545 Low back pain: Secondary | ICD-10-CM | POA: Diagnosis not present

## 2018-02-04 DIAGNOSIS — Z23 Encounter for immunization: Secondary | ICD-10-CM | POA: Diagnosis not present

## 2018-02-05 DIAGNOSIS — E039 Hypothyroidism, unspecified: Secondary | ICD-10-CM | POA: Diagnosis not present

## 2018-02-05 DIAGNOSIS — E785 Hyperlipidemia, unspecified: Secondary | ICD-10-CM | POA: Diagnosis not present

## 2018-02-17 DIAGNOSIS — Z79891 Long term (current) use of opiate analgesic: Secondary | ICD-10-CM | POA: Diagnosis not present

## 2018-02-17 DIAGNOSIS — M47816 Spondylosis without myelopathy or radiculopathy, lumbar region: Secondary | ICD-10-CM | POA: Diagnosis not present

## 2018-02-17 DIAGNOSIS — M961 Postlaminectomy syndrome, not elsewhere classified: Secondary | ICD-10-CM | POA: Diagnosis not present

## 2018-02-17 DIAGNOSIS — G894 Chronic pain syndrome: Secondary | ICD-10-CM | POA: Diagnosis not present

## 2018-04-14 DIAGNOSIS — G894 Chronic pain syndrome: Secondary | ICD-10-CM | POA: Diagnosis not present

## 2018-04-14 DIAGNOSIS — Z79891 Long term (current) use of opiate analgesic: Secondary | ICD-10-CM | POA: Diagnosis not present

## 2018-04-14 DIAGNOSIS — M961 Postlaminectomy syndrome, not elsewhere classified: Secondary | ICD-10-CM | POA: Diagnosis not present

## 2018-04-14 DIAGNOSIS — M47816 Spondylosis without myelopathy or radiculopathy, lumbar region: Secondary | ICD-10-CM | POA: Diagnosis not present

## 2018-06-01 DIAGNOSIS — Z23 Encounter for immunization: Secondary | ICD-10-CM | POA: Diagnosis not present

## 2018-06-07 DIAGNOSIS — H04123 Dry eye syndrome of bilateral lacrimal glands: Secondary | ICD-10-CM | POA: Diagnosis not present

## 2018-06-15 DIAGNOSIS — G894 Chronic pain syndrome: Secondary | ICD-10-CM | POA: Diagnosis not present

## 2018-06-15 DIAGNOSIS — M961 Postlaminectomy syndrome, not elsewhere classified: Secondary | ICD-10-CM | POA: Diagnosis not present

## 2018-06-15 DIAGNOSIS — Z79891 Long term (current) use of opiate analgesic: Secondary | ICD-10-CM | POA: Diagnosis not present

## 2018-06-15 DIAGNOSIS — M47816 Spondylosis without myelopathy or radiculopathy, lumbar region: Secondary | ICD-10-CM | POA: Diagnosis not present

## 2018-08-03 DIAGNOSIS — I10 Essential (primary) hypertension: Secondary | ICD-10-CM | POA: Diagnosis not present

## 2018-08-03 DIAGNOSIS — F341 Dysthymic disorder: Secondary | ICD-10-CM | POA: Diagnosis not present

## 2018-08-03 DIAGNOSIS — E785 Hyperlipidemia, unspecified: Secondary | ICD-10-CM | POA: Diagnosis not present

## 2018-08-03 DIAGNOSIS — Z6824 Body mass index (BMI) 24.0-24.9, adult: Secondary | ICD-10-CM | POA: Diagnosis not present

## 2018-08-17 DIAGNOSIS — M961 Postlaminectomy syndrome, not elsewhere classified: Secondary | ICD-10-CM | POA: Diagnosis not present

## 2018-08-17 DIAGNOSIS — M47816 Spondylosis without myelopathy or radiculopathy, lumbar region: Secondary | ICD-10-CM | POA: Diagnosis not present

## 2018-08-17 DIAGNOSIS — Z79891 Long term (current) use of opiate analgesic: Secondary | ICD-10-CM | POA: Diagnosis not present

## 2018-08-17 DIAGNOSIS — G894 Chronic pain syndrome: Secondary | ICD-10-CM | POA: Diagnosis not present

## 2018-10-19 DIAGNOSIS — G894 Chronic pain syndrome: Secondary | ICD-10-CM | POA: Diagnosis not present

## 2018-10-19 DIAGNOSIS — Z79891 Long term (current) use of opiate analgesic: Secondary | ICD-10-CM | POA: Diagnosis not present

## 2018-10-19 DIAGNOSIS — M961 Postlaminectomy syndrome, not elsewhere classified: Secondary | ICD-10-CM | POA: Diagnosis not present

## 2018-10-19 DIAGNOSIS — M47816 Spondylosis without myelopathy or radiculopathy, lumbar region: Secondary | ICD-10-CM | POA: Diagnosis not present

## 2018-12-14 DIAGNOSIS — G894 Chronic pain syndrome: Secondary | ICD-10-CM | POA: Diagnosis not present

## 2018-12-14 DIAGNOSIS — M47816 Spondylosis without myelopathy or radiculopathy, lumbar region: Secondary | ICD-10-CM | POA: Diagnosis not present

## 2018-12-14 DIAGNOSIS — Z79891 Long term (current) use of opiate analgesic: Secondary | ICD-10-CM | POA: Diagnosis not present

## 2018-12-14 DIAGNOSIS — M961 Postlaminectomy syndrome, not elsewhere classified: Secondary | ICD-10-CM | POA: Diagnosis not present

## 2018-12-24 DIAGNOSIS — Z1231 Encounter for screening mammogram for malignant neoplasm of breast: Secondary | ICD-10-CM | POA: Diagnosis not present

## 2019-02-03 DIAGNOSIS — E039 Hypothyroidism, unspecified: Secondary | ICD-10-CM | POA: Diagnosis not present

## 2019-02-03 DIAGNOSIS — F341 Dysthymic disorder: Secondary | ICD-10-CM | POA: Diagnosis not present

## 2019-02-03 DIAGNOSIS — M545 Low back pain: Secondary | ICD-10-CM | POA: Diagnosis not present

## 2019-02-03 DIAGNOSIS — E785 Hyperlipidemia, unspecified: Secondary | ICD-10-CM | POA: Diagnosis not present

## 2019-02-07 DIAGNOSIS — H04123 Dry eye syndrome of bilateral lacrimal glands: Secondary | ICD-10-CM | POA: Diagnosis not present

## 2019-02-10 DIAGNOSIS — M961 Postlaminectomy syndrome, not elsewhere classified: Secondary | ICD-10-CM | POA: Diagnosis not present

## 2019-02-10 DIAGNOSIS — G894 Chronic pain syndrome: Secondary | ICD-10-CM | POA: Diagnosis not present

## 2019-02-10 DIAGNOSIS — Z79891 Long term (current) use of opiate analgesic: Secondary | ICD-10-CM | POA: Diagnosis not present

## 2019-02-10 DIAGNOSIS — M47816 Spondylosis without myelopathy or radiculopathy, lumbar region: Secondary | ICD-10-CM | POA: Diagnosis not present

## 2019-04-07 DIAGNOSIS — M961 Postlaminectomy syndrome, not elsewhere classified: Secondary | ICD-10-CM | POA: Diagnosis not present

## 2019-04-07 DIAGNOSIS — M47816 Spondylosis without myelopathy or radiculopathy, lumbar region: Secondary | ICD-10-CM | POA: Diagnosis not present

## 2019-04-07 DIAGNOSIS — Z79891 Long term (current) use of opiate analgesic: Secondary | ICD-10-CM | POA: Diagnosis not present

## 2019-04-07 DIAGNOSIS — G894 Chronic pain syndrome: Secondary | ICD-10-CM | POA: Diagnosis not present

## 2019-04-16 DIAGNOSIS — H6121 Impacted cerumen, right ear: Secondary | ICD-10-CM | POA: Diagnosis not present

## 2019-06-02 DIAGNOSIS — M47816 Spondylosis without myelopathy or radiculopathy, lumbar region: Secondary | ICD-10-CM | POA: Diagnosis not present

## 2019-06-02 DIAGNOSIS — G894 Chronic pain syndrome: Secondary | ICD-10-CM | POA: Diagnosis not present

## 2019-06-02 DIAGNOSIS — M961 Postlaminectomy syndrome, not elsewhere classified: Secondary | ICD-10-CM | POA: Diagnosis not present

## 2019-06-02 DIAGNOSIS — Z79891 Long term (current) use of opiate analgesic: Secondary | ICD-10-CM | POA: Diagnosis not present

## 2019-06-07 DIAGNOSIS — S63502A Unspecified sprain of left wrist, initial encounter: Secondary | ICD-10-CM | POA: Diagnosis not present

## 2019-06-15 DIAGNOSIS — S63502A Unspecified sprain of left wrist, initial encounter: Secondary | ICD-10-CM | POA: Diagnosis not present

## 2019-06-27 DIAGNOSIS — M25532 Pain in left wrist: Secondary | ICD-10-CM | POA: Diagnosis not present

## 2019-07-27 DIAGNOSIS — M25532 Pain in left wrist: Secondary | ICD-10-CM | POA: Diagnosis not present

## 2019-07-28 DIAGNOSIS — Z79891 Long term (current) use of opiate analgesic: Secondary | ICD-10-CM | POA: Diagnosis not present

## 2019-07-28 DIAGNOSIS — G894 Chronic pain syndrome: Secondary | ICD-10-CM | POA: Diagnosis not present

## 2019-07-28 DIAGNOSIS — M961 Postlaminectomy syndrome, not elsewhere classified: Secondary | ICD-10-CM | POA: Diagnosis not present

## 2019-07-28 DIAGNOSIS — M47816 Spondylosis without myelopathy or radiculopathy, lumbar region: Secondary | ICD-10-CM | POA: Diagnosis not present

## 2019-08-04 DIAGNOSIS — F341 Dysthymic disorder: Secondary | ICD-10-CM | POA: Diagnosis not present

## 2019-08-04 DIAGNOSIS — E039 Hypothyroidism, unspecified: Secondary | ICD-10-CM | POA: Diagnosis not present

## 2019-08-04 DIAGNOSIS — M545 Low back pain: Secondary | ICD-10-CM | POA: Diagnosis not present

## 2019-08-04 DIAGNOSIS — Z23 Encounter for immunization: Secondary | ICD-10-CM | POA: Diagnosis not present

## 2019-08-04 DIAGNOSIS — E785 Hyperlipidemia, unspecified: Secondary | ICD-10-CM | POA: Diagnosis not present

## 2019-08-17 DIAGNOSIS — M25532 Pain in left wrist: Secondary | ICD-10-CM | POA: Diagnosis not present

## 2019-09-22 DIAGNOSIS — G894 Chronic pain syndrome: Secondary | ICD-10-CM | POA: Diagnosis not present

## 2019-09-22 DIAGNOSIS — M47816 Spondylosis without myelopathy or radiculopathy, lumbar region: Secondary | ICD-10-CM | POA: Diagnosis not present

## 2019-09-22 DIAGNOSIS — M961 Postlaminectomy syndrome, not elsewhere classified: Secondary | ICD-10-CM | POA: Diagnosis not present

## 2019-09-22 DIAGNOSIS — Z79891 Long term (current) use of opiate analgesic: Secondary | ICD-10-CM | POA: Diagnosis not present

## 2019-10-07 DIAGNOSIS — S52502A Unspecified fracture of the lower end of left radius, initial encounter for closed fracture: Secondary | ICD-10-CM | POA: Diagnosis not present

## 2019-10-10 DIAGNOSIS — M25532 Pain in left wrist: Secondary | ICD-10-CM | POA: Diagnosis not present

## 2019-10-21 DIAGNOSIS — M25532 Pain in left wrist: Secondary | ICD-10-CM | POA: Diagnosis not present

## 2019-11-14 DIAGNOSIS — M25532 Pain in left wrist: Secondary | ICD-10-CM | POA: Diagnosis not present

## 2019-12-22 DIAGNOSIS — S52502A Unspecified fracture of the lower end of left radius, initial encounter for closed fracture: Secondary | ICD-10-CM | POA: Diagnosis not present

## 2019-12-22 DIAGNOSIS — S52552A Other extraarticular fracture of lower end of left radius, initial encounter for closed fracture: Secondary | ICD-10-CM | POA: Diagnosis not present

## 2019-12-22 DIAGNOSIS — X58XXXA Exposure to other specified factors, initial encounter: Secondary | ICD-10-CM | POA: Diagnosis not present

## 2019-12-22 DIAGNOSIS — Y999 Unspecified external cause status: Secondary | ICD-10-CM | POA: Diagnosis not present

## 2019-12-22 DIAGNOSIS — G5602 Carpal tunnel syndrome, left upper limb: Secondary | ICD-10-CM | POA: Diagnosis not present

## 2020-01-02 DIAGNOSIS — S52502A Unspecified fracture of the lower end of left radius, initial encounter for closed fracture: Secondary | ICD-10-CM | POA: Diagnosis not present

## 2020-01-02 DIAGNOSIS — M25532 Pain in left wrist: Secondary | ICD-10-CM | POA: Diagnosis not present

## 2020-01-02 DIAGNOSIS — Z9889 Other specified postprocedural states: Secondary | ICD-10-CM | POA: Diagnosis not present

## 2020-01-17 DIAGNOSIS — M47816 Spondylosis without myelopathy or radiculopathy, lumbar region: Secondary | ICD-10-CM | POA: Diagnosis not present

## 2020-01-17 DIAGNOSIS — G894 Chronic pain syndrome: Secondary | ICD-10-CM | POA: Diagnosis not present

## 2020-01-17 DIAGNOSIS — M961 Postlaminectomy syndrome, not elsewhere classified: Secondary | ICD-10-CM | POA: Diagnosis not present

## 2020-01-17 DIAGNOSIS — Z79891 Long term (current) use of opiate analgesic: Secondary | ICD-10-CM | POA: Diagnosis not present

## 2020-01-25 DIAGNOSIS — Z9889 Other specified postprocedural states: Secondary | ICD-10-CM | POA: Diagnosis not present

## 2020-02-07 DIAGNOSIS — F341 Dysthymic disorder: Secondary | ICD-10-CM | POA: Diagnosis not present

## 2020-02-07 DIAGNOSIS — M545 Low back pain: Secondary | ICD-10-CM | POA: Diagnosis not present

## 2020-02-07 DIAGNOSIS — E785 Hyperlipidemia, unspecified: Secondary | ICD-10-CM | POA: Diagnosis not present

## 2020-02-07 DIAGNOSIS — E039 Hypothyroidism, unspecified: Secondary | ICD-10-CM | POA: Diagnosis not present

## 2020-02-07 DIAGNOSIS — I10 Essential (primary) hypertension: Secondary | ICD-10-CM | POA: Diagnosis not present

## 2020-02-28 DIAGNOSIS — Z1231 Encounter for screening mammogram for malignant neoplasm of breast: Secondary | ICD-10-CM | POA: Diagnosis not present

## 2020-02-29 DIAGNOSIS — Z9889 Other specified postprocedural states: Secondary | ICD-10-CM | POA: Diagnosis not present

## 2020-02-29 DIAGNOSIS — S52502A Unspecified fracture of the lower end of left radius, initial encounter for closed fracture: Secondary | ICD-10-CM | POA: Diagnosis not present

## 2020-03-05 DIAGNOSIS — H04123 Dry eye syndrome of bilateral lacrimal glands: Secondary | ICD-10-CM | POA: Diagnosis not present

## 2020-03-13 DIAGNOSIS — Z79891 Long term (current) use of opiate analgesic: Secondary | ICD-10-CM | POA: Diagnosis not present

## 2020-03-13 DIAGNOSIS — M961 Postlaminectomy syndrome, not elsewhere classified: Secondary | ICD-10-CM | POA: Diagnosis not present

## 2020-03-13 DIAGNOSIS — M47816 Spondylosis without myelopathy or radiculopathy, lumbar region: Secondary | ICD-10-CM | POA: Diagnosis not present

## 2020-03-13 DIAGNOSIS — G894 Chronic pain syndrome: Secondary | ICD-10-CM | POA: Diagnosis not present

## 2020-05-09 DIAGNOSIS — M961 Postlaminectomy syndrome, not elsewhere classified: Secondary | ICD-10-CM | POA: Diagnosis not present

## 2020-05-09 DIAGNOSIS — Z79891 Long term (current) use of opiate analgesic: Secondary | ICD-10-CM | POA: Diagnosis not present

## 2020-05-09 DIAGNOSIS — M47816 Spondylosis without myelopathy or radiculopathy, lumbar region: Secondary | ICD-10-CM | POA: Diagnosis not present

## 2020-05-09 DIAGNOSIS — G894 Chronic pain syndrome: Secondary | ICD-10-CM | POA: Diagnosis not present

## 2020-06-11 DIAGNOSIS — M25532 Pain in left wrist: Secondary | ICD-10-CM | POA: Diagnosis not present

## 2020-07-04 DIAGNOSIS — Z79891 Long term (current) use of opiate analgesic: Secondary | ICD-10-CM | POA: Diagnosis not present

## 2020-07-04 DIAGNOSIS — G894 Chronic pain syndrome: Secondary | ICD-10-CM | POA: Diagnosis not present

## 2020-07-04 DIAGNOSIS — M961 Postlaminectomy syndrome, not elsewhere classified: Secondary | ICD-10-CM | POA: Diagnosis not present

## 2020-07-04 DIAGNOSIS — M47816 Spondylosis without myelopathy or radiculopathy, lumbar region: Secondary | ICD-10-CM | POA: Diagnosis not present

## 2020-08-08 DIAGNOSIS — Z0000 Encounter for general adult medical examination without abnormal findings: Secondary | ICD-10-CM | POA: Diagnosis not present

## 2020-08-08 DIAGNOSIS — E039 Hypothyroidism, unspecified: Secondary | ICD-10-CM | POA: Diagnosis not present

## 2020-08-08 DIAGNOSIS — Z79891 Long term (current) use of opiate analgesic: Secondary | ICD-10-CM | POA: Diagnosis not present

## 2020-08-08 DIAGNOSIS — E785 Hyperlipidemia, unspecified: Secondary | ICD-10-CM | POA: Diagnosis not present

## 2020-08-08 DIAGNOSIS — Z0001 Encounter for general adult medical examination with abnormal findings: Secondary | ICD-10-CM | POA: Diagnosis not present

## 2020-08-08 DIAGNOSIS — Z01419 Encounter for gynecological examination (general) (routine) without abnormal findings: Secondary | ICD-10-CM | POA: Diagnosis not present

## 2020-08-08 DIAGNOSIS — N814 Uterovaginal prolapse, unspecified: Secondary | ICD-10-CM | POA: Diagnosis not present

## 2020-08-08 DIAGNOSIS — Z01411 Encounter for gynecological examination (general) (routine) with abnormal findings: Secondary | ICD-10-CM | POA: Diagnosis not present

## 2020-08-29 DIAGNOSIS — G894 Chronic pain syndrome: Secondary | ICD-10-CM | POA: Diagnosis not present

## 2020-08-29 DIAGNOSIS — Z79891 Long term (current) use of opiate analgesic: Secondary | ICD-10-CM | POA: Diagnosis not present

## 2020-08-29 DIAGNOSIS — M961 Postlaminectomy syndrome, not elsewhere classified: Secondary | ICD-10-CM | POA: Diagnosis not present

## 2020-08-29 DIAGNOSIS — M47816 Spondylosis without myelopathy or radiculopathy, lumbar region: Secondary | ICD-10-CM | POA: Diagnosis not present

## 2020-09-13 DIAGNOSIS — E039 Hypothyroidism, unspecified: Secondary | ICD-10-CM | POA: Diagnosis not present

## 2020-09-26 DIAGNOSIS — M62830 Muscle spasm of back: Secondary | ICD-10-CM | POA: Diagnosis not present

## 2020-09-26 DIAGNOSIS — M47817 Spondylosis without myelopathy or radiculopathy, lumbosacral region: Secondary | ICD-10-CM | POA: Diagnosis not present

## 2020-09-26 DIAGNOSIS — M9904 Segmental and somatic dysfunction of sacral region: Secondary | ICD-10-CM | POA: Diagnosis not present

## 2020-09-26 DIAGNOSIS — M9902 Segmental and somatic dysfunction of thoracic region: Secondary | ICD-10-CM | POA: Diagnosis not present

## 2020-09-26 DIAGNOSIS — M9901 Segmental and somatic dysfunction of cervical region: Secondary | ICD-10-CM | POA: Diagnosis not present

## 2020-09-26 DIAGNOSIS — M9905 Segmental and somatic dysfunction of pelvic region: Secondary | ICD-10-CM | POA: Diagnosis not present

## 2020-10-02 DIAGNOSIS — M47817 Spondylosis without myelopathy or radiculopathy, lumbosacral region: Secondary | ICD-10-CM | POA: Diagnosis not present

## 2020-10-02 DIAGNOSIS — M9905 Segmental and somatic dysfunction of pelvic region: Secondary | ICD-10-CM | POA: Diagnosis not present

## 2020-10-02 DIAGNOSIS — M9902 Segmental and somatic dysfunction of thoracic region: Secondary | ICD-10-CM | POA: Diagnosis not present

## 2020-10-02 DIAGNOSIS — M62830 Muscle spasm of back: Secondary | ICD-10-CM | POA: Diagnosis not present

## 2020-10-02 DIAGNOSIS — M9904 Segmental and somatic dysfunction of sacral region: Secondary | ICD-10-CM | POA: Diagnosis not present

## 2020-10-04 DIAGNOSIS — M9904 Segmental and somatic dysfunction of sacral region: Secondary | ICD-10-CM | POA: Diagnosis not present

## 2020-10-04 DIAGNOSIS — M9905 Segmental and somatic dysfunction of pelvic region: Secondary | ICD-10-CM | POA: Diagnosis not present

## 2020-10-04 DIAGNOSIS — M47817 Spondylosis without myelopathy or radiculopathy, lumbosacral region: Secondary | ICD-10-CM | POA: Diagnosis not present

## 2020-10-04 DIAGNOSIS — M62830 Muscle spasm of back: Secondary | ICD-10-CM | POA: Diagnosis not present

## 2020-10-04 DIAGNOSIS — M9901 Segmental and somatic dysfunction of cervical region: Secondary | ICD-10-CM | POA: Diagnosis not present

## 2020-10-04 DIAGNOSIS — M9902 Segmental and somatic dysfunction of thoracic region: Secondary | ICD-10-CM | POA: Diagnosis not present

## 2020-10-09 DIAGNOSIS — M9901 Segmental and somatic dysfunction of cervical region: Secondary | ICD-10-CM | POA: Diagnosis not present

## 2020-10-09 DIAGNOSIS — M9904 Segmental and somatic dysfunction of sacral region: Secondary | ICD-10-CM | POA: Diagnosis not present

## 2020-10-09 DIAGNOSIS — M9902 Segmental and somatic dysfunction of thoracic region: Secondary | ICD-10-CM | POA: Diagnosis not present

## 2020-10-09 DIAGNOSIS — M9905 Segmental and somatic dysfunction of pelvic region: Secondary | ICD-10-CM | POA: Diagnosis not present

## 2020-10-09 DIAGNOSIS — M62830 Muscle spasm of back: Secondary | ICD-10-CM | POA: Diagnosis not present

## 2020-10-09 DIAGNOSIS — M47817 Spondylosis without myelopathy or radiculopathy, lumbosacral region: Secondary | ICD-10-CM | POA: Diagnosis not present

## 2020-11-01 DIAGNOSIS — M47816 Spondylosis without myelopathy or radiculopathy, lumbar region: Secondary | ICD-10-CM | POA: Diagnosis not present

## 2020-11-01 DIAGNOSIS — M961 Postlaminectomy syndrome, not elsewhere classified: Secondary | ICD-10-CM | POA: Diagnosis not present

## 2020-11-01 DIAGNOSIS — Z79891 Long term (current) use of opiate analgesic: Secondary | ICD-10-CM | POA: Diagnosis not present

## 2020-11-01 DIAGNOSIS — G894 Chronic pain syndrome: Secondary | ICD-10-CM | POA: Diagnosis not present

## 2021-10-04 ENCOUNTER — Other Ambulatory Visit: Payer: Self-pay | Admitting: Orthopaedic Surgery

## 2021-10-21 NOTE — Patient Instructions (Addendum)
DUE TO COVID-19 ONLY ONE VISITOR IS ALLOWED TO COME WITH YOU AND STAY IN THE WAITING ROOM ONLY DURING PRE OP AND PROCEDURE.   **NO VISITORS ARE ALLOWED IN THE SHORT STAY AREA OR RECOVERY ROOM!!**       Your procedure is scheduled on: 11/12/21   Report to [facility] Main Entrance    Report to admitting at 11:35 AM   Call this number if you have problems the morning of surgery [phone]   Do not eat food :After Midnight.   May have liquids until 10:50 AM day of surgery  CLEAR LIQUID DIET  Foods Allowed                                                                     Foods Excluded  Water, Black Coffee and tea (no milk or creamer)           liquids that you cannot  Plain Jell-O in any flavor  (No red)                                    see through such as: Fruit ices (not with fruit pulp)                                           milk, soups, orange juice              Iced Popsicles (No red)                                              All solid food                                   Apple juices Sports drinks like Gatorade (No red) Lightly seasoned clear broth or consume(fat free) Sugar      The day of surgery:  Drink ONE (1) Pre-Surgery Clear Ensure by 10:50 am the morning of surgery. Drink in one sitting. Do not sip.  This drink was given to you during your hospital  pre-op appointment visit. Nothing else to drink after completing the  Pre-Surgery Clear Ensure.          If you have questions, please contact your surgeon's office.     Oral Hygiene is also important to reduce your risk of infection.                                    Remember - BRUSH YOUR TEETH THE MORNING OF SURGERY WITH YOUR REGULAR TOOTHPASTE   Take these medicines the morning of surgery with A SIP OF WATER: Xanax, Prozac, Synthroid, MS contin  You may not have any metal on your body including hair pins, jewelry, and body piercing             Do not wear  make-up, lotions, powders, perfumes, or deodorant  Do not wear nail polish including gel and S&S, artificial/acrylic nails, or any other type of covering on natural nails including finger and toenails. If you have artificial nails, gel coating, etc. that needs to be removed by a nail salon please have this removed prior to surgery or surgery may need to be canceled/ delayed if the surgeon/ anesthesia feels like they are unable to be safely monitored.   Do not shave  48 hours prior to surgery.    Do not bring valuables to the hospital. [place].    Patients discharged on the day of surgery will not be allowed to drive home.  Special Instructions: Bring a copy of your healthcare power of attorney and living will documents         the day of surgery if you haven't scanned them before.  Please read over the following fact sheets you were given: IF YOU HAVE QUESTIONS ABOUT YOUR PRE-OP INSTRUCTIONS PLEASE CALL [facility] - Preparing for Surgery Before surgery, you can play an important role.  Because skin is not sterile, your skin needs to be as free of germs as possible.  You can reduce the number of germs on your skin by washing with CHG (chlorahexidine gluconate) soap before surgery.  CHG is an antiseptic cleaner which kills germs and bonds with the skin to continue killing germs even after washing. Please DO NOT use if you have an allergy to CHG or antibacterial soaps.  If your skin becomes reddened/irritated stop using the CHG and inform your nurse when you arrive at Short Stay. Do not shave (including legs and underarms) for at least 48 hours prior to the first CHG shower.  You may shave your face/neck.  Please follow these instructions carefully:  1.  Shower with CHG Soap the night before surgery and the  morning of surgery.  2.  If you choose to wash your hair, wash your hair first as usual with your normal   shampoo.  3.  After you shampoo, rinse your hair and body thoroughly to remove the shampoo.                             4.  Use CHG as you would any other liquid soap.  You can apply chg directly to the skin and wash.  Gently with a scrungie or clean washcloth.  5.  Apply the CHG Soap to your body ONLY FROM THE NECK DOWN.   Do   not use on face/ open                           Wound or open sores. Avoid contact with eyes, ears mouth and   genitals (private parts).                       Wash face,  Genitals (private parts) with your normal soap.             6.  Wash thoroughly, paying special attention to the area where your  surgery  will be performed.  7.  Thoroughly rinse your body with warm water from the neck down.  8.  DO NOT shower/wash with your normal soap after using and rinsing off the CHG Soap.                9.  Pat yourself dry with a clean towel.            10.  Wear clean pajamas.            11.  Place clean sheets on your bed the night of your first shower and do not  sleep with pets. Day of Surgery : Do not apply any lotions/deodorants the morning of surgery.  Please wear clean clothes to the hospital/surgery center.  FAILURE TO FOLLOW THESE INSTRUCTIONS MAY RESULT IN THE CANCELLATION OF YOUR SURGERY  PATIENT SIGNATURE_________________________________  NURSE SIGNATURE__________________________________  ________________________________________________________________________   [name]  An incentive spirometer is a tool that can help keep your lungs clear and active. This tool measures how well you are filling your lungs with each breath. Taking long deep breaths may help reverse or decrease the chance of developing breathing (pulmonary) problems (especially infection) following: A long period of time when you are unable to move or be active. BEFORE THE PROCEDURE  If the spirometer includes an indicator to show your best effort, your nurse or respiratory  therapist will set it to a desired goal. If possible, sit up straight or lean slightly forward. Try not to slouch. Hold the incentive spirometer in an upright position. INSTRUCTIONS FOR USE  Sit on the edge of your bed if possible, or sit up as far as you can in bed or on a chair. Hold the incentive spirometer in an upright position. Breathe out normally. Place the mouthpiece in your mouth and seal your lips tightly around it. Breathe in slowly and as deeply as possible, raising the piston or the ball toward the top of the column. Hold your breath for 3-5 seconds or for as long as possible. Allow the piston or ball to fall to the bottom of the column. Remove the mouthpiece from your mouth and breathe out normally. Rest for a few seconds and repeat Steps 1 through 7 at least 10 times every 1-2 hours when you are awake. Take your time and take a few normal breaths between deep breaths. The spirometer may include an indicator to show your best effort. Use the indicator as a goal to work toward during each repetition. After each set of 10 deep breaths, practice coughing to be sure your lungs are clear. If you have an incision (the cut made at the time of surgery), support your incision when coughing by placing a pillow or rolled up towels firmly against it. Once you are able to get out of bed, walk around indoors and cough well. You may stop using the incentive spirometer when instructed by your caregiver.  RISKS AND COMPLICATIONS Take your time so you do not get dizzy or light-headed. If you are in pain, you may need to take or ask for pain medication before doing incentive spirometry. It is harder to take a deep breath if you are having pain. AFTER USE Rest and breathe slowly and easily. It can be helpful to keep track of a log of your progress. Your caregiver can provide you with a simple table to help with this. If you are using the spirometer at home, follow these instructions: [place] IF:  You are having difficultly using the spirometer. You have trouble using the spirometer as often as instructed. Your pain medication is not giving enough relief while using the spirometer. You develop fever of 100.5 F (38.1 C) or higher. SEEK IMMEDIATE MEDICAL CARE IF:  You cough up bloody sputum that had not been present before. You develop fever of 102 F (38.9 C) or greater. You develop worsening pain at or near the incision site. MAKE SURE YOU:  Understand these instructions. Will watch your condition. Will get help right away if you are not doing well or get worse. Document Released: 04/13/2007 Document Revised: 02/23/2012 Document Reviewed: 06/14/2007 [facility] Patient Information 2014 [place], [place].   ________________________________________________________________________

## 2021-10-21 NOTE — Progress Notes (Addendum)
COVID swab appointment: n/a  COVID Vaccine Completed: no Date COVID Vaccine completed: Has received booster: COVID vaccine manufacturer: [employer]   Date of COVID positive in last 90 days: no  PCP - Lucita Lora, NP Cardiologist - n/a  Chest x-ray - n/a EKG - n/a Stress Test - n/a ECHO - n/a Cardiac Cath - n/a Pacemaker/ICD device last checked: n/a Spinal Cord Stimulator: n/a  Sleep Study - n/a CPAP -   Fasting Blood Sugar - n/a Checks Blood Sugar _____ times a day  Blood Thinner Instructions: n/a Aspirin Instructions: Last Dose:  Activity level: Can go up a flight of stairs and perform activities of daily living without stopping and without symptoms of chest pain or shortness of breath.      Anesthesia review:   Patient denies shortness of breath, fever, cough and chest pain at PAT appointment   Patient verbalized understanding of instructions that were given to them at the PAT appointment. Patient was also instructed that they will need to review over the PAT instructions again at home before surgery.

## 2021-10-22 ENCOUNTER — Encounter (HOSPITAL_COMMUNITY)
Admission: RE | Admit: 2021-10-22 | Discharge: 2021-10-22 | Disposition: A | Discharge status: Home / Self Care | Payer: Medicare Other | Source: Ambulatory Visit | Attending: Orthopaedic Surgery | Admitting: Orthopaedic Surgery

## 2021-10-22 ENCOUNTER — Encounter (HOSPITAL_COMMUNITY): Payer: Self-pay

## 2021-10-22 DIAGNOSIS — Z01812 Encounter for preprocedural laboratory examination: Secondary | ICD-10-CM | POA: Diagnosis present

## 2021-10-22 HISTORY — DX: Personal history of urinary calculi: Z87.442

## 2021-11-11 NOTE — H&P (Signed)
[name] is an 60 y.o. female.   Chief Complaint: left wrist pain HPI: [name] is a couple of years from her wrist operation.  She has fallen on this side a couple of times since and worries that something has come loose.  We did do a carpal tunnel release at the time of her wrist ORIF and she has no numbness.  She has some volar aspect pain at the wrist when she grips things but also an ulnar aspect pain.    Radiographs:  X-rays that were ordered, performed, and interpreted by me today included 3 views of the left wrist.  She has a healed fracture with the DVR plate in place.  It looks a bit prominent.  Past Medical History:  Diagnosis Date   Anxiety    Arthritis    Blood transfusion without reported diagnosis    1988, 2000   Cancer ([place]) 2016   melanoma, rt arm   Cataract    Chronic back pain    Depression    Heart murmur    History of kidney stones    Thyroid disease    graves disease    Past Surgical History:  Procedure Laterality Date   back fusion  1988   back fusion  [place]   WRIST FRACTURE SURGERY Left     Family History  Problem Relation Age of Onset   Prostate cancer Father    Rectal cancer Father    Colon cancer Neg Hx    Esophageal cancer Neg Hx    Stomach cancer Neg Hx    Social History:  reports that she quit smoking about 33 years ago. Her smoking use included cigarettes. She has never used smokeless tobacco. She reports that she does not drink alcohol and does not use drugs.  Allergies: No Known Allergies  No medications prior to admission.    No results found for this or any previous visit (from the past 48 hour(s)). No results found.  Review of Systems  Musculoskeletal:  Positive for arthralgias.       Left wrist  All other systems reviewed and are negative.  There were no vitals taken for this visit. Physical Exam Constitutional:      Appearance: Normal appearance.  HENT:     Head:  Normocephalic and atraumatic.     Nose: Nose normal.     Mouth/Throat:     Pharynx: Oropharynx is clear.  Eyes:     Extraocular Movements: Extraocular movements intact.  Cardiovascular:     Rate and Rhythm: Normal rate and regular rhythm.  Pulmonary:     Effort: Pulmonary effort is normal.  Abdominal:     Palpations: Abdomen is soft.  Musculoskeletal:     Cervical back: Normal range of motion.     Comments: Left wrist has a fullness on the volar aspect and she has some pain to palpation there.  She also has a mild radial deformity especially with full supination.  There is some pain over the distal ulna.  Sensation is intact throughout.  Her scar is benign and difficult to find.    Skin:    General: Skin is warm and dry.  Neurological:     General: No focal deficit present.     Mental Status: She is alert and oriented to person, place, and time. Mental status is at baseline.  Psychiatric:        Mood and Affect: Mood  normal.        Behavior: Behavior normal.        Thought Content: Thought content normal.        Judgment: Judgment normal.     Assessment/Plan Assessment: Status post left wrist ORIF/CTR 12/22/19 with hardware pain  Plan: I think we can help [name] with removal of her hardware.  I reviewed risk of anesthesia, infection, neurovascular injury.  I told her we can do this through her old incision and she might need a splint for a couple of weeks.    Renee Sachs Rylea Selway, PA-C 11/11/2021, 5:58 PM

## 2021-11-12 ENCOUNTER — Ambulatory Visit (HOSPITAL_COMMUNITY): Payer: Medicare Other | Admitting: Physician Assistant

## 2021-11-12 ENCOUNTER — Encounter (HOSPITAL_COMMUNITY): Payer: Self-pay | Admitting: Orthopaedic Surgery

## 2021-11-12 ENCOUNTER — Other Ambulatory Visit: Payer: Self-pay

## 2021-11-12 ENCOUNTER — Ambulatory Visit (HOSPITAL_COMMUNITY): Payer: Medicare Other | Admitting: Certified Registered Nurse Anesthetist

## 2021-11-12 ENCOUNTER — Encounter (HOSPITAL_COMMUNITY)
Admission: RE | Disposition: A | Discharge status: Home / Self Care | Payer: Self-pay | Source: Home / Self Care | Attending: Orthopaedic Surgery

## 2021-11-12 ENCOUNTER — Ambulatory Visit (HOSPITAL_COMMUNITY)
Admission: RE | Admit: 2021-11-12 | Discharge: 2021-11-12 | Disposition: A | Discharge status: Home / Self Care | Payer: Medicare Other | Attending: Orthopaedic Surgery | Admitting: Orthopaedic Surgery

## 2021-11-12 DIAGNOSIS — Y831 Surgical operation with implant of artificial internal device as the cause of abnormal reaction of the patient, or of later complication, without mention of misadventure at the time of the procedure: Secondary | ICD-10-CM | POA: Insufficient documentation

## 2021-11-12 DIAGNOSIS — T8484XA Pain due to internal orthopedic prosthetic devices, implants and grafts, initial encounter: Secondary | ICD-10-CM | POA: Diagnosis present

## 2021-11-12 DIAGNOSIS — X58XXXA Exposure to other specified factors, initial encounter: Secondary | ICD-10-CM | POA: Insufficient documentation

## 2021-11-12 DIAGNOSIS — Z87891 Personal history of nicotine dependence: Secondary | ICD-10-CM | POA: Diagnosis not present

## 2021-11-12 HISTORY — PX: HARDWARE REMOVAL: SHX979

## 2021-11-12 SURGERY — REMOVAL, HARDWARE
Anesthesia: Monitor Anesthesia Care | Laterality: Left

## 2021-11-12 MED ORDER — OXYCODONE HCL 5 MG/5ML PO SOLN: 5.0000 mg | Freq: Once | ORAL | Status: DC | PRN

## 2021-11-12 MED ORDER — TRANEXAMIC ACID 1000 MG/10ML IV SOLN
2000.0000 mg | INTRAVENOUS | Status: DC
  Filled 2021-11-12: qty 20

## 2021-11-12 MED ORDER — FENTANYL CITRATE PF 50 MCG/ML IJ SOSY
PREFILLED_SYRINGE | INTRAMUSCULAR | Status: AC
  Filled 2021-11-12: qty 2

## 2021-11-12 MED ORDER — ONDANSETRON HCL 4 MG/2ML IJ SOLN
INTRAMUSCULAR | Status: AC
  Filled 2021-11-12: qty 2

## 2021-11-12 MED ORDER — PHENYLEPHRINE 40 MCG/ML (10ML) SYRINGE FOR IV PUSH (FOR BLOOD PRESSURE SUPPORT)
PREFILLED_SYRINGE | INTRAVENOUS | Status: DC | PRN
  Administered 2021-11-12: 80 ug via INTRAVENOUS
  Administered 2021-11-12: 40 ug via INTRAVENOUS
  Administered 2021-11-12 (×2): 80 ug via INTRAVENOUS
  Administered 2021-11-12: 40 ug via INTRAVENOUS

## 2021-11-12 MED ORDER — ROPIVACAINE HCL 5 MG/ML IJ SOLN
INTRAMUSCULAR | Status: DC | PRN
  Administered 2021-11-12: 30 mL via PERINEURAL

## 2021-11-12 MED ORDER — FENTANYL CITRATE PF 50 MCG/ML IJ SOSY
50.0000 ug | PREFILLED_SYRINGE | INTRAMUSCULAR | Status: DC
  Administered 2021-11-12: 50 ug via INTRAVENOUS

## 2021-11-12 MED ORDER — MIDAZOLAM HCL 2 MG/2ML IJ SOLN
INTRAMUSCULAR | Status: AC
  Filled 2021-11-12: qty 2

## 2021-11-12 MED ORDER — ONDANSETRON HCL 4 MG/2ML IJ SOLN
INTRAMUSCULAR | Status: DC | PRN
  Administered 2021-11-12: 4 mg via INTRAVENOUS

## 2021-11-12 MED ORDER — DEXAMETHASONE SODIUM PHOSPHATE 10 MG/ML IJ SOLN
INTRAMUSCULAR | Status: AC
  Filled 2021-11-12: qty 1

## 2021-11-12 MED ORDER — BUPIVACAINE-EPINEPHRINE (PF) 0.25% -1:200000 IJ SOLN
INTRAMUSCULAR | Status: AC
  Filled 2021-11-12: qty 30

## 2021-11-12 MED ORDER — KETOROLAC TROMETHAMINE 30 MG/ML IJ SOLN: 30.0000 mg | Freq: Once | INTRAMUSCULAR | Status: DC | PRN

## 2021-11-12 MED ORDER — MIDAZOLAM HCL 2 MG/2ML IJ SOLN
1.0000 mg | INTRAMUSCULAR | Status: DC
Start: 2021-11-12 — End: 2021-11-12
  Administered 2021-11-12: 1 mg via INTRAVENOUS

## 2021-11-12 MED ORDER — BUPIVACAINE-EPINEPHRINE (PF) 0.5% -1:200000 IJ SOLN
INTRAMUSCULAR | Status: DC | PRN
  Administered 2021-11-12: 5 mL

## 2021-11-12 MED ORDER — CEFAZOLIN SODIUM-DEXTROSE 2-4 GM/100ML-% IV SOLN
INTRAVENOUS | Status: AC
  Filled 2021-11-12: qty 100

## 2021-11-12 MED ORDER — PROPOFOL 10 MG/ML IV BOLUS
INTRAVENOUS | Status: AC
  Filled 2021-11-12: qty 20

## 2021-11-12 MED ORDER — OXYCODONE HCL 5 MG PO TABS: 5.0000 mg | ORAL_TABLET | Freq: Once | ORAL | Status: DC | PRN

## 2021-11-12 MED ORDER — LACTATED RINGERS IV SOLN: INTRAVENOUS | Status: DC

## 2021-11-12 MED ORDER — PROPOFOL 500 MG/50ML IV EMUL
INTRAVENOUS | Status: DC | PRN
  Administered 2021-11-12: 75 ug/kg/min via INTRAVENOUS

## 2021-11-12 MED ORDER — PHENYLEPHRINE 40 MCG/ML (10ML) SYRINGE FOR IV PUSH (FOR BLOOD PRESSURE SUPPORT)
PREFILLED_SYRINGE | INTRAVENOUS | Status: AC
  Filled 2021-11-12: qty 10

## 2021-11-12 MED ORDER — CEFAZOLIN SODIUM-DEXTROSE 2-4 GM/100ML-% IV SOLN
2.0000 g | INTRAVENOUS | Status: AC
  Administered 2021-11-12: 2 g via INTRAVENOUS

## 2021-11-12 MED ORDER — CHLORHEXIDINE GLUCONATE 0.12 % MT SOLN
15.0000 mL | Freq: Once | OROMUCOSAL | Status: AC
  Administered 2021-11-12: 15 mL via OROMUCOSAL

## 2021-11-12 MED ORDER — ORAL CARE MOUTH RINSE: 15.0000 mL | Freq: Once | OROMUCOSAL | Status: AC

## 2021-11-12 MED ORDER — 0.9 % SODIUM CHLORIDE (POUR BTL) OPTIME
TOPICAL | Status: DC | PRN
  Administered 2021-11-12: 1000 mL

## 2021-11-12 MED ORDER — ONDANSETRON HCL 4 MG/2ML IJ SOLN: 4.0000 mg | Freq: Once | INTRAMUSCULAR | Status: DC | PRN

## 2021-11-12 MED ORDER — PROPOFOL 1000 MG/100ML IV EMUL
INTRAVENOUS | Status: AC
  Filled 2021-11-12: qty 100

## 2021-11-12 MED ORDER — PROPOFOL 500 MG/50ML IV EMUL
INTRAVENOUS | Status: DC | PRN
Start: 2021-11-12 — End: 2021-11-12
  Administered 2021-11-12 (×2): 20 mg via INTRAVENOUS

## 2021-11-12 MED ORDER — TRANEXAMIC ACID-NACL 1000-0.7 MG/100ML-% IV SOLN: 1000.0000 mg | INTRAVENOUS | Status: DC

## 2021-11-12 MED ORDER — FENTANYL CITRATE PF 50 MCG/ML IJ SOSY: 25.0000 ug | PREFILLED_SYRINGE | INTRAMUSCULAR | Status: DC | PRN

## 2021-11-12 MED ORDER — TRANEXAMIC ACID-NACL 1000-0.7 MG/100ML-% IV SOLN
INTRAVENOUS | Status: AC
  Filled 2021-11-12: qty 100

## 2021-11-12 SURGICAL SUPPLY — 30 items
BAG COUNTER SPONGE SURGICOUNT (BAG) IMPLANT
BAG SPNG CNTER NS LX DISP (BAG)
BNDG COHESIVE 1X5 TAN STRL LF (GAUZE/BANDAGES/DRESSINGS) ×1 IMPLANT
BNDG COHESIVE 3X5 TAN ST LF (GAUZE/BANDAGES/DRESSINGS) ×1 IMPLANT
BNDG GAUZE ELAST 4 BULKY (GAUZE/BANDAGES/DRESSINGS) ×1 IMPLANT
CLSR STERI-STRIP ANTIMIC 1/2X4 (GAUZE/BANDAGES/DRESSINGS) ×1 IMPLANT
COVER MAYO STAND STRL (DRAPES) ×1 IMPLANT
COVER SURGICAL LIGHT HANDLE (MISCELLANEOUS) ×2 IMPLANT
DRAPE C-ARM 42X120 X-RAY (DRAPES) ×1 IMPLANT
DRAPE EXTREMITY T 121X128X90 (DISPOSABLE) ×2 IMPLANT
DRSG EMULSION OIL 3X3 NADH (GAUZE/BANDAGES/DRESSINGS) ×1 IMPLANT
DRSG MEPILEX BORDER 4X8 (GAUZE/BANDAGES/DRESSINGS) ×2 IMPLANT
DURAPREP 26ML APPLICATOR (WOUND CARE) ×2 IMPLANT
GAUZE SPONGE 4X4 12PLY STRL (GAUZE/BANDAGES/DRESSINGS) ×1 IMPLANT
GLOVE SRG 8 PF TXTR STRL LF DI (GLOVE) ×2 IMPLANT
GLOVE SURG ENC MOIS LTX SZ8.5 (GLOVE) ×2 IMPLANT
GLOVE SURG ENC TEXT LTX SZ8 (GLOVE) ×4 IMPLANT
GLOVE SURG UNDER POLY LF SZ8 (GLOVE) ×4
GOWN STRL REUS W/TWL XL LVL3 (GOWN DISPOSABLE) ×4 IMPLANT
PACK GENERAL/GYN (CUSTOM PROCEDURE TRAY) ×2 IMPLANT
PENCIL SMOKE EVACUATOR (MISCELLANEOUS) IMPLANT
PROTECTOR NERVE ULNAR (MISCELLANEOUS) ×1 IMPLANT
SLING ARM FOAM STRAP LRG (SOFTGOODS) ×1 IMPLANT
SPONGE T-LAP 18X18 ~~LOC~~+RFID (SPONGE) ×2 IMPLANT
SUT MNCRL AB 3-0 PS2 18 (SUTURE) ×1 IMPLANT
SUT MNCRL AB 4-0 PS2 18 (SUTURE) ×1 IMPLANT
SUT VIC AB 1 CT1 36 (SUTURE) ×1 IMPLANT
SUT VIC AB 2-0 CT1 27 (SUTURE)
SUT VIC AB 2-0 CT1 TAPERPNT 27 (SUTURE) ×1 IMPLANT
SUT VIC AB 2-0 PS2 27 (SUTURE) ×1 IMPLANT

## 2021-11-12 NOTE — Anesthesia Procedure Notes (Signed)
Procedure Name: MAC Date/Time: 11/12/2021 12:54 PM Performed by: West Pugh, CRNA Pre-anesthesia Checklist: Patient identified, Emergency Drugs available, Suction available, Patient being monitored and Timeout performed Patient Re-evaluated:Patient Re-evaluated prior to induction Oxygen Delivery Method: Simple face mask Preoxygenation: Pre-oxygenation with 100% oxygen Induction Type: IV induction Placement Confirmation: positive ETCO2 Dental Injury: Teeth and Oropharynx as per pre-operative assessment

## 2021-11-12 NOTE — Anesthesia Procedure Notes (Signed)
Anesthesia Regional Block: Supraclavicular block   Pre-Anesthetic Checklist: , timeout performed,  Correct Patient, Correct Site, Correct Laterality,  Correct Procedure, Correct Position, site marked,  Risks and benefits discussed,  Surgical consent,  Pre-op evaluation,  At surgeon's request and post-op pain management  Laterality: Left  Prep: chloraprep       Needles:  Injection technique: Single-shot  Needle Type: Echogenic Needle     Needle Length: 9cm      Additional Needles:   Procedures:,,,, ultrasound used (permanent image in chart),,    Narrative:  Start time: 11/12/2021 11:42 AM End time: 11/12/2021 11:50 AM Injection made incrementally with aspirations every 5 mL.  Performed by: Personally  Anesthesiologist: Myrtie Soman, MD  Additional Notes: Patient tolerated the procedure well without complications

## 2021-11-12 NOTE — Transfer of Care (Signed)
Immediate Anesthesia Transfer of Care Note  Patient: [name]  Procedure(s) Performed: LEFT WRIST HARDWARE REMOVAL(DVR PLATE) (Left)  Patient Location: PACU  Anesthesia Type:MAC combined with regional for post-op pain  Level of Consciousness: awake, alert , oriented and patient cooperative  Airway & Oxygen Therapy: Patient Spontanous Breathing and Patient connected to nasal cannula oxygen  Post-op Assessment: Report given to RN and Post -op Vital signs reviewed and stable  Post vital signs: Reviewed and stable  Last Vitals:  Vitals Value Taken Time  BP    Temp    Pulse 71 11/12/21 1402  Resp 19 11/12/21 1402  SpO2 100 % 11/12/21 1402  Vitals shown include unvalidated device data.  Last Pain:  Vitals:   11/12/21 1145  TempSrc: Oral  PainSc: 0-No pain         Complications: No notable events documented.

## 2021-11-12 NOTE — Op Note (Signed)
NAMEYUNIQUE, DEARCOS MEDICAL RECORD NO: [id number] ACCOUNT NO: [id number] DATE OF BIRTH: Sep 13, 1961 FACILITY: [facility] LOCATION: [facility] PHYSICIAN: Monico Blitz. Rhona Raider, MD  Operative Report   DATE OF PROCEDURE: 11/12/2021  PREOPERATIVE DIAGNOSIS:  Painful hardware, left wrist.  POSTOPERATIVE DIAGNOSIS:  Painful hardware, left wrist.  PROCEDURE:  Removal of hardware, left wrist.  ANESTHESIA:  Block and MAC.  ATTENDING SURGEON:  Monico Blitz. Rhona Raider, MD  ASSISTANT:  Loni Dolly, PA.  INDICATIONS:  The patient is a 60 year old woman who is a couple of years out from a wrist fracture eventually treated with a DVR plate.  This has become a bit prominent and bothers her at the volar aspect of her wrist.  She is offered removal.  Informed  operative consent was obtained after discussion of possible complications including neurovascular injury, infection, and fracture.  SUMMARY OF FINDINGS AND PROCEDURE:  Under regional block and sedation through her old incision we removed a [employer] DVR plate without any difficulty.  She was closed primarily and scheduled to go home same day.  DESCRIPTION OF PROCEDURE:  The patient was taken to the operating suite, where sedation was applied by anesthesia.  She was also given a block in the preanesthesia area.  She was positioned supine and prepped and draped in normal sterile fashion.  After  the administration of preoperative IV Kefzol and appropriate timeout, the left arm was elevated, exsanguinated and a tourniquet inflated about the upper arm.  We utilized her old incision at the volar aspect of the wrist with dissection down to the FCR  tendon.  We went through the base of this tendon sheath to expose the plate.  We removed all the proximal and distal screws and took the plate out simply.  The wound was then irrigated, followed by release of the tourniquet.  A small amount of bleeding  was easily controlled with Bovie cautery and some pressure.  We then  reapproximated subcutaneous tissues and skin with absorbable sutures.  Adaptic was applied followed by dry gauze and a bulky soft dressing.  ESTIMATED BLOOD LOSS:  Obtained from anesthesia records.  INTRAOPERATIVE FLUIDS:  Obtained from anesthesia records.  ACCURATE TOURNIQUET TIME:  Obtained from anesthesia records.  DISPOSITION:  The patient was taken to recovery room in stable condition.  Plans were for her to go home same day and follow up in the office in less than a week.  I will contact her by phone tonight.   PUS D: 11/12/2021 1:45:14 pm T: 11/12/2021 3:11:00 pm  JOB: [id number]/ [id number]

## 2021-11-12 NOTE — Anesthesia Procedure Notes (Signed)
Anesthesia Procedure Image    

## 2021-11-12 NOTE — Progress Notes (Signed)
Assisted Dr. Kalman Shan with left, ultrasound guided, interscalene  block. Side rails up, monitors on throughout procedure. See vital signs in flow sheet. Tolerated Procedure well.

## 2021-11-12 NOTE — Interval H&P Note (Signed)
History and Physical Interval Note:  11/12/2021 11:56 AM  [name]  has presented today for surgery, with the diagnosis of LEFT WRIST PAINFUL HARDWARE.  The various methods of treatment have been discussed with the patient and family. After consideration of risks, benefits and other options for treatment, the patient has consented to  Procedure(s): LEFT WRIST HARDWARE REMOVAL(DVR PLATE) (Left) as a surgical intervention.  The patient's history has been reviewed, patient examined, no change in status, stable for surgery.  I have reviewed the patient's chart and labs.  Questions were answered to the patient's satisfaction.     [name]

## 2021-11-12 NOTE — Anesthesia Preprocedure Evaluation (Signed)
Anesthesia Evaluation  Patient identified by MRN, date of birth, ID band Patient awake    Reviewed: Allergy & Precautions, H&P , NPO status , Patient's Chart, lab work & pertinent test results  Airway Mallampati: II  TM Distance: >3 FB Neck ROM: Full    Dental no notable dental hx.    Pulmonary neg pulmonary ROS, former smoker,    Pulmonary exam normal breath sounds clear to auscultation       Cardiovascular negative cardio ROS Normal cardiovascular exam Rhythm:Regular Rate:Normal     Neuro/Psych Anxiety Chronic pain     GI/Hepatic Neg liver ROS, GERD  ,  Endo/Other  Hyperthyroidism   Renal/GU negative Renal ROS  negative genitourinary   Musculoskeletal negative musculoskeletal ROS (+)   Abdominal   Peds negative pediatric ROS (+)  Hematology negative hematology ROS (+)   Anesthesia Other Findings   Reproductive/Obstetrics negative OB ROS                             Anesthesia Physical Anesthesia Plan  ASA: 2  Anesthesia Plan: MAC   Post-op Pain Management: Regional block   Induction: Intravenous  PONV Risk Score and Plan: 2 and Ondansetron, Propofol infusion and Treatment may vary due to age or medical condition  Airway Management Planned: Simple Face Mask  Additional Equipment:   Intra-op Plan:   Post-operative Plan:   Informed Consent: I have reviewed the patients History and Physical, chart, labs and discussed the procedure including the risks, benefits and alternatives for the proposed anesthesia with the patient or authorized representative who has indicated his/her understanding and acceptance.     Dental advisory given  Plan Discussed with: CRNA and Surgeon  Anesthesia Plan Comments:         Anesthesia Quick Evaluation

## 2021-11-12 NOTE — Brief Op Note (Signed)
[name] [id number] 11/12/2021   PRE-OP DIAGNOSIS: painful hardware left wrist  POST-OP DIAGNOSIS: same  PROCEDURE: removal hardware left wrist  ANESTHESIA: block and [place]   Dictation #:  [id number]

## 2021-11-13 ENCOUNTER — Encounter (HOSPITAL_COMMUNITY): Payer: Self-pay | Admitting: Orthopaedic Surgery

## 2021-11-13 NOTE — Anesthesia Postprocedure Evaluation (Signed)
Anesthesia Post Note  Patient: [name]  Procedure(s) Performed: LEFT WRIST HARDWARE REMOVAL(DVR PLATE) (Left)     Patient location during evaluation: PACU Anesthesia Type: MAC Level of consciousness: awake and alert Pain management: pain level controlled Vital Signs Assessment: post-procedure vital signs reviewed and stable Respiratory status: spontaneous breathing, nonlabored ventilation, respiratory function stable and patient connected to nasal cannula oxygen Cardiovascular status: stable and blood pressure returned to baseline Postop Assessment: no apparent nausea or vomiting Anesthetic complications: no   No notable events documented.  Last Vitals:  Vitals:   11/12/21 1415 11/12/21 1430  BP: (!) 112/91 101/63  Pulse: 70 69  Resp: (!) 28 15  Temp:  (!) 36.1 C  SpO2: 98% 100%    Last Pain:  Vitals:   11/12/21 1444  TempSrc:   PainSc: 0-No pain                 Loralie Malta S

## 2022-02-13 DIAGNOSIS — E039 Hypothyroidism, unspecified: Secondary | ICD-10-CM | POA: Diagnosis not present

## 2022-02-13 DIAGNOSIS — Z79891 Long term (current) use of opiate analgesic: Secondary | ICD-10-CM | POA: Diagnosis not present

## 2022-02-13 DIAGNOSIS — Z2821 Immunization not carried out because of patient refusal: Secondary | ICD-10-CM | POA: Diagnosis not present

## 2022-02-13 DIAGNOSIS — Z6820 Body mass index (BMI) 20.0-20.9, adult: Secondary | ICD-10-CM | POA: Diagnosis not present

## 2022-02-13 DIAGNOSIS — M545 Low back pain, unspecified: Secondary | ICD-10-CM | POA: Diagnosis not present

## 2022-03-12 DIAGNOSIS — M961 Postlaminectomy syndrome, not elsewhere classified: Secondary | ICD-10-CM | POA: Diagnosis not present

## 2022-03-12 DIAGNOSIS — G894 Chronic pain syndrome: Secondary | ICD-10-CM | POA: Diagnosis not present

## 2022-03-12 DIAGNOSIS — M47816 Spondylosis without myelopathy or radiculopathy, lumbar region: Secondary | ICD-10-CM | POA: Diagnosis not present

## 2022-03-12 DIAGNOSIS — Z79891 Long term (current) use of opiate analgesic: Secondary | ICD-10-CM | POA: Diagnosis not present

## 2022-04-07 DIAGNOSIS — M47816 Spondylosis without myelopathy or radiculopathy, lumbar region: Secondary | ICD-10-CM | POA: Diagnosis not present

## 2022-04-07 DIAGNOSIS — G894 Chronic pain syndrome: Secondary | ICD-10-CM | POA: Diagnosis not present

## 2022-04-07 DIAGNOSIS — M961 Postlaminectomy syndrome, not elsewhere classified: Secondary | ICD-10-CM | POA: Diagnosis not present

## 2022-04-07 DIAGNOSIS — Z79891 Long term (current) use of opiate analgesic: Secondary | ICD-10-CM | POA: Diagnosis not present

## 2022-04-07 DIAGNOSIS — H04123 Dry eye syndrome of bilateral lacrimal glands: Secondary | ICD-10-CM | POA: Diagnosis not present

## 2022-05-06 DIAGNOSIS — G894 Chronic pain syndrome: Secondary | ICD-10-CM | POA: Diagnosis not present

## 2022-05-06 DIAGNOSIS — Z79891 Long term (current) use of opiate analgesic: Secondary | ICD-10-CM | POA: Diagnosis not present

## 2022-05-06 DIAGNOSIS — M47816 Spondylosis without myelopathy or radiculopathy, lumbar region: Secondary | ICD-10-CM | POA: Diagnosis not present

## 2022-05-06 DIAGNOSIS — M961 Postlaminectomy syndrome, not elsewhere classified: Secondary | ICD-10-CM | POA: Diagnosis not present

## 2022-06-09 DIAGNOSIS — H6122 Impacted cerumen, left ear: Secondary | ICD-10-CM | POA: Diagnosis not present

## 2022-06-09 DIAGNOSIS — H9192 Unspecified hearing loss, left ear: Secondary | ICD-10-CM | POA: Diagnosis not present

## 2022-07-01 DIAGNOSIS — M961 Postlaminectomy syndrome, not elsewhere classified: Secondary | ICD-10-CM | POA: Diagnosis not present

## 2022-07-01 DIAGNOSIS — Z79891 Long term (current) use of opiate analgesic: Secondary | ICD-10-CM | POA: Diagnosis not present

## 2022-07-01 DIAGNOSIS — G894 Chronic pain syndrome: Secondary | ICD-10-CM | POA: Diagnosis not present

## 2022-07-01 DIAGNOSIS — M47816 Spondylosis without myelopathy or radiculopathy, lumbar region: Secondary | ICD-10-CM | POA: Diagnosis not present

## 2022-07-21 DIAGNOSIS — J208 Acute bronchitis due to other specified organisms: Secondary | ICD-10-CM | POA: Diagnosis not present

## 2022-07-21 DIAGNOSIS — B9689 Other specified bacterial agents as the cause of diseases classified elsewhere: Secondary | ICD-10-CM | POA: Diagnosis not present

## 2022-07-29 DIAGNOSIS — H938X3 Other specified disorders of ear, bilateral: Secondary | ICD-10-CM | POA: Diagnosis not present

## 2022-07-29 DIAGNOSIS — J208 Acute bronchitis due to other specified organisms: Secondary | ICD-10-CM | POA: Diagnosis not present

## 2022-08-19 DIAGNOSIS — M545 Low back pain, unspecified: Secondary | ICD-10-CM | POA: Diagnosis not present

## 2022-08-19 DIAGNOSIS — E039 Hypothyroidism, unspecified: Secondary | ICD-10-CM | POA: Diagnosis not present

## 2022-08-19 DIAGNOSIS — B9689 Other specified bacterial agents as the cause of diseases classified elsewhere: Secondary | ICD-10-CM | POA: Diagnosis not present

## 2022-08-19 DIAGNOSIS — Z79891 Long term (current) use of opiate analgesic: Secondary | ICD-10-CM | POA: Diagnosis not present

## 2022-08-19 DIAGNOSIS — Z2821 Immunization not carried out because of patient refusal: Secondary | ICD-10-CM | POA: Diagnosis not present

## 2022-08-19 DIAGNOSIS — J208 Acute bronchitis due to other specified organisms: Secondary | ICD-10-CM | POA: Diagnosis not present

## 2022-08-26 DIAGNOSIS — M47816 Spondylosis without myelopathy or radiculopathy, lumbar region: Secondary | ICD-10-CM | POA: Diagnosis not present

## 2022-08-26 DIAGNOSIS — G894 Chronic pain syndrome: Secondary | ICD-10-CM | POA: Diagnosis not present

## 2022-08-26 DIAGNOSIS — Z79891 Long term (current) use of opiate analgesic: Secondary | ICD-10-CM | POA: Diagnosis not present

## 2022-08-26 DIAGNOSIS — M961 Postlaminectomy syndrome, not elsewhere classified: Secondary | ICD-10-CM | POA: Diagnosis not present

## 2022-08-28 DIAGNOSIS — Z79891 Long term (current) use of opiate analgesic: Secondary | ICD-10-CM | POA: Diagnosis not present

## 2022-08-28 DIAGNOSIS — G894 Chronic pain syndrome: Secondary | ICD-10-CM | POA: Diagnosis not present

## 2022-10-21 DIAGNOSIS — G894 Chronic pain syndrome: Secondary | ICD-10-CM | POA: Diagnosis not present

## 2022-10-21 DIAGNOSIS — Z79891 Long term (current) use of opiate analgesic: Secondary | ICD-10-CM | POA: Diagnosis not present

## 2022-10-21 DIAGNOSIS — M961 Postlaminectomy syndrome, not elsewhere classified: Secondary | ICD-10-CM | POA: Diagnosis not present

## 2022-10-21 DIAGNOSIS — M47816 Spondylosis without myelopathy or radiculopathy, lumbar region: Secondary | ICD-10-CM | POA: Diagnosis not present

## 2022-12-16 DIAGNOSIS — M47816 Spondylosis without myelopathy or radiculopathy, lumbar region: Secondary | ICD-10-CM | POA: Diagnosis not present

## 2022-12-16 DIAGNOSIS — M961 Postlaminectomy syndrome, not elsewhere classified: Secondary | ICD-10-CM | POA: Diagnosis not present

## 2022-12-16 DIAGNOSIS — Z79891 Long term (current) use of opiate analgesic: Secondary | ICD-10-CM | POA: Diagnosis not present

## 2022-12-16 DIAGNOSIS — G894 Chronic pain syndrome: Secondary | ICD-10-CM | POA: Diagnosis not present

## 2023-02-10 DIAGNOSIS — G894 Chronic pain syndrome: Secondary | ICD-10-CM | POA: Diagnosis not present

## 2023-02-10 DIAGNOSIS — M47816 Spondylosis without myelopathy or radiculopathy, lumbar region: Secondary | ICD-10-CM | POA: Diagnosis not present

## 2023-02-10 DIAGNOSIS — M961 Postlaminectomy syndrome, not elsewhere classified: Secondary | ICD-10-CM | POA: Diagnosis not present

## 2023-02-10 DIAGNOSIS — Z79891 Long term (current) use of opiate analgesic: Secondary | ICD-10-CM | POA: Diagnosis not present

## 2023-04-08 DIAGNOSIS — M961 Postlaminectomy syndrome, not elsewhere classified: Secondary | ICD-10-CM | POA: Diagnosis not present

## 2023-04-08 DIAGNOSIS — G894 Chronic pain syndrome: Secondary | ICD-10-CM | POA: Diagnosis not present

## 2023-04-08 DIAGNOSIS — M47816 Spondylosis without myelopathy or radiculopathy, lumbar region: Secondary | ICD-10-CM | POA: Diagnosis not present

## 2023-04-08 DIAGNOSIS — Z79891 Long term (current) use of opiate analgesic: Secondary | ICD-10-CM | POA: Diagnosis not present

## 2023-04-13 DIAGNOSIS — H524 Presbyopia: Secondary | ICD-10-CM | POA: Diagnosis not present

## 2023-04-13 DIAGNOSIS — H25813 Combined forms of age-related cataract, bilateral: Secondary | ICD-10-CM | POA: Diagnosis not present

## 2023-04-20 DIAGNOSIS — Z79891 Long term (current) use of opiate analgesic: Secondary | ICD-10-CM | POA: Diagnosis not present

## 2023-04-20 DIAGNOSIS — M545 Low back pain, unspecified: Secondary | ICD-10-CM | POA: Diagnosis not present

## 2023-04-20 DIAGNOSIS — Z85820 Personal history of malignant melanoma of skin: Secondary | ICD-10-CM | POA: Diagnosis not present

## 2023-04-20 DIAGNOSIS — E039 Hypothyroidism, unspecified: Secondary | ICD-10-CM | POA: Diagnosis not present

## 2023-04-29 DIAGNOSIS — Z1231 Encounter for screening mammogram for malignant neoplasm of breast: Secondary | ICD-10-CM | POA: Diagnosis not present

## 2023-06-03 DIAGNOSIS — G894 Chronic pain syndrome: Secondary | ICD-10-CM | POA: Diagnosis not present

## 2023-06-03 DIAGNOSIS — Z79891 Long term (current) use of opiate analgesic: Secondary | ICD-10-CM | POA: Diagnosis not present

## 2023-06-03 DIAGNOSIS — M961 Postlaminectomy syndrome, not elsewhere classified: Secondary | ICD-10-CM | POA: Diagnosis not present

## 2023-06-03 DIAGNOSIS — M47816 Spondylosis without myelopathy or radiculopathy, lumbar region: Secondary | ICD-10-CM | POA: Diagnosis not present

## 2023-07-30 DIAGNOSIS — M47816 Spondylosis without myelopathy or radiculopathy, lumbar region: Secondary | ICD-10-CM | POA: Diagnosis not present

## 2023-07-30 DIAGNOSIS — G894 Chronic pain syndrome: Secondary | ICD-10-CM | POA: Diagnosis not present

## 2023-07-30 DIAGNOSIS — M961 Postlaminectomy syndrome, not elsewhere classified: Secondary | ICD-10-CM | POA: Diagnosis not present

## 2023-07-30 DIAGNOSIS — Z79891 Long term (current) use of opiate analgesic: Secondary | ICD-10-CM | POA: Diagnosis not present

## 2023-08-02 DIAGNOSIS — Z79891 Long term (current) use of opiate analgesic: Secondary | ICD-10-CM | POA: Diagnosis not present

## 2023-08-02 DIAGNOSIS — G894 Chronic pain syndrome: Secondary | ICD-10-CM | POA: Diagnosis not present

## 2023-09-24 DIAGNOSIS — M47816 Spondylosis without myelopathy or radiculopathy, lumbar region: Secondary | ICD-10-CM | POA: Diagnosis not present

## 2023-09-24 DIAGNOSIS — G894 Chronic pain syndrome: Secondary | ICD-10-CM | POA: Diagnosis not present

## 2023-09-24 DIAGNOSIS — M961 Postlaminectomy syndrome, not elsewhere classified: Secondary | ICD-10-CM | POA: Diagnosis not present

## 2023-09-24 DIAGNOSIS — Z79891 Long term (current) use of opiate analgesic: Secondary | ICD-10-CM | POA: Diagnosis not present

## 2023-10-22 DIAGNOSIS — Z85820 Personal history of malignant melanoma of skin: Secondary | ICD-10-CM | POA: Diagnosis not present

## 2023-10-22 DIAGNOSIS — Z79899 Other long term (current) drug therapy: Secondary | ICD-10-CM | POA: Diagnosis not present

## 2023-10-22 DIAGNOSIS — M545 Low back pain, unspecified: Secondary | ICD-10-CM | POA: Diagnosis not present

## 2023-10-22 DIAGNOSIS — Z6821 Body mass index (BMI) 21.0-21.9, adult: Secondary | ICD-10-CM | POA: Diagnosis not present

## 2023-10-22 DIAGNOSIS — E039 Hypothyroidism, unspecified: Secondary | ICD-10-CM | POA: Diagnosis not present

## 2023-10-22 DIAGNOSIS — Z13220 Encounter for screening for lipoid disorders: Secondary | ICD-10-CM | POA: Diagnosis not present

## 2023-11-19 DIAGNOSIS — M47816 Spondylosis without myelopathy or radiculopathy, lumbar region: Secondary | ICD-10-CM | POA: Diagnosis not present

## 2023-11-19 DIAGNOSIS — Z79891 Long term (current) use of opiate analgesic: Secondary | ICD-10-CM | POA: Diagnosis not present

## 2023-11-19 DIAGNOSIS — M961 Postlaminectomy syndrome, not elsewhere classified: Secondary | ICD-10-CM | POA: Diagnosis not present

## 2023-11-19 DIAGNOSIS — G894 Chronic pain syndrome: Secondary | ICD-10-CM | POA: Diagnosis not present

## 2023-12-03 DIAGNOSIS — E039 Hypothyroidism, unspecified: Secondary | ICD-10-CM | POA: Diagnosis not present

## 2024-01-18 DIAGNOSIS — M47816 Spondylosis without myelopathy or radiculopathy, lumbar region: Secondary | ICD-10-CM | POA: Diagnosis not present

## 2024-01-18 DIAGNOSIS — G894 Chronic pain syndrome: Secondary | ICD-10-CM | POA: Diagnosis not present

## 2024-01-18 DIAGNOSIS — Z79891 Long term (current) use of opiate analgesic: Secondary | ICD-10-CM | POA: Diagnosis not present

## 2024-01-18 DIAGNOSIS — M961 Postlaminectomy syndrome, not elsewhere classified: Secondary | ICD-10-CM | POA: Diagnosis not present

## 2024-01-26 DIAGNOSIS — R059 Cough, unspecified: Secondary | ICD-10-CM | POA: Diagnosis not present

## 2024-01-26 DIAGNOSIS — J208 Acute bronchitis due to other specified organisms: Secondary | ICD-10-CM | POA: Diagnosis not present

## 2024-01-26 DIAGNOSIS — B9689 Other specified bacterial agents as the cause of diseases classified elsewhere: Secondary | ICD-10-CM | POA: Diagnosis not present

## 2024-03-17 DIAGNOSIS — M47816 Spondylosis without myelopathy or radiculopathy, lumbar region: Secondary | ICD-10-CM | POA: Diagnosis not present

## 2024-03-17 DIAGNOSIS — M961 Postlaminectomy syndrome, not elsewhere classified: Secondary | ICD-10-CM | POA: Diagnosis not present

## 2024-03-17 DIAGNOSIS — Z79891 Long term (current) use of opiate analgesic: Secondary | ICD-10-CM | POA: Diagnosis not present

## 2024-03-17 DIAGNOSIS — G894 Chronic pain syndrome: Secondary | ICD-10-CM | POA: Diagnosis not present

## 2024-04-21 DIAGNOSIS — Z79899 Other long term (current) drug therapy: Secondary | ICD-10-CM | POA: Diagnosis not present

## 2024-04-21 DIAGNOSIS — E785 Hyperlipidemia, unspecified: Secondary | ICD-10-CM | POA: Diagnosis not present

## 2024-04-21 DIAGNOSIS — Z6821 Body mass index (BMI) 21.0-21.9, adult: Secondary | ICD-10-CM | POA: Diagnosis not present

## 2024-04-21 DIAGNOSIS — Z85820 Personal history of malignant melanoma of skin: Secondary | ICD-10-CM | POA: Diagnosis not present

## 2024-04-21 DIAGNOSIS — E039 Hypothyroidism, unspecified: Secondary | ICD-10-CM | POA: Diagnosis not present

## 2024-04-21 DIAGNOSIS — Z13220 Encounter for screening for lipoid disorders: Secondary | ICD-10-CM | POA: Diagnosis not present

## 2024-05-12 DIAGNOSIS — G894 Chronic pain syndrome: Secondary | ICD-10-CM | POA: Diagnosis not present

## 2024-05-12 DIAGNOSIS — M47816 Spondylosis without myelopathy or radiculopathy, lumbar region: Secondary | ICD-10-CM | POA: Diagnosis not present

## 2024-05-12 DIAGNOSIS — Z79891 Long term (current) use of opiate analgesic: Secondary | ICD-10-CM | POA: Diagnosis not present

## 2024-05-12 DIAGNOSIS — M961 Postlaminectomy syndrome, not elsewhere classified: Secondary | ICD-10-CM | POA: Diagnosis not present

## 2024-07-07 DIAGNOSIS — Z79891 Long term (current) use of opiate analgesic: Secondary | ICD-10-CM | POA: Diagnosis not present

## 2024-07-07 DIAGNOSIS — M47816 Spondylosis without myelopathy or radiculopathy, lumbar region: Secondary | ICD-10-CM | POA: Diagnosis not present

## 2024-07-07 DIAGNOSIS — G894 Chronic pain syndrome: Secondary | ICD-10-CM | POA: Diagnosis not present

## 2024-07-07 DIAGNOSIS — M961 Postlaminectomy syndrome, not elsewhere classified: Secondary | ICD-10-CM | POA: Diagnosis not present

## 2024-09-06 DIAGNOSIS — G894 Chronic pain syndrome: Secondary | ICD-10-CM | POA: Diagnosis not present

## 2024-09-06 DIAGNOSIS — M47816 Spondylosis without myelopathy or radiculopathy, lumbar region: Secondary | ICD-10-CM | POA: Diagnosis not present

## 2024-09-06 DIAGNOSIS — Z79891 Long term (current) use of opiate analgesic: Secondary | ICD-10-CM | POA: Diagnosis not present

## 2024-09-06 DIAGNOSIS — M961 Postlaminectomy syndrome, not elsewhere classified: Secondary | ICD-10-CM | POA: Diagnosis not present
